# Patient Record
Sex: Female | Born: 1945 | Race: Black or African American | Hispanic: No | Smoking: Never smoker
Health system: Southern US, Community
[De-identification: ages and names within clinical notes are randomized; demographics above are authoritative.]

## PROBLEM LIST (undated history)

## (undated) DIAGNOSIS — E7800 Pure hypercholesterolemia, unspecified: Secondary | ICD-10-CM

## (undated) DIAGNOSIS — R42 Dizziness and giddiness: Secondary | ICD-10-CM

## (undated) DIAGNOSIS — E559 Vitamin D deficiency, unspecified: Secondary | ICD-10-CM

## (undated) DIAGNOSIS — I10 Essential (primary) hypertension: Secondary | ICD-10-CM

## (undated) HISTORY — PX: ANKLE SURGERY: SHX546

---

## 1997-11-08 ENCOUNTER — Encounter: Admission: RE | Admit: 1997-11-08 | Discharge: 1997-11-08 | Payer: Self-pay | Admitting: Hematology and Oncology

## 1998-03-24 ENCOUNTER — Emergency Department (HOSPITAL_COMMUNITY): Admission: EM | Admit: 1998-03-24 | Discharge: 1998-03-24 | Payer: Self-pay | Admitting: Family Medicine

## 1998-03-30 ENCOUNTER — Encounter: Admission: RE | Admit: 1998-03-30 | Discharge: 1998-03-30 | Payer: Self-pay | Admitting: Internal Medicine

## 1998-04-24 ENCOUNTER — Encounter: Admission: RE | Admit: 1998-04-24 | Discharge: 1998-04-24 | Payer: Self-pay | Admitting: Internal Medicine

## 1998-06-13 ENCOUNTER — Encounter: Admission: RE | Admit: 1998-06-13 | Discharge: 1998-06-13 | Payer: Self-pay | Admitting: Internal Medicine

## 1998-06-20 ENCOUNTER — Inpatient Hospital Stay (HOSPITAL_COMMUNITY): Admission: EM | Admit: 1998-06-20 | Discharge: 1998-06-22 | Payer: Self-pay | Admitting: Emergency Medicine

## 1998-06-22 ENCOUNTER — Encounter: Payer: Self-pay | Admitting: Internal Medicine

## 1998-07-05 ENCOUNTER — Encounter: Admission: RE | Admit: 1998-07-05 | Discharge: 1998-07-05 | Payer: Self-pay | Admitting: Internal Medicine

## 1998-07-05 ENCOUNTER — Other Ambulatory Visit: Admission: RE | Admit: 1998-07-05 | Discharge: 1998-07-05 | Payer: Self-pay | Admitting: *Deleted

## 1998-10-16 ENCOUNTER — Encounter: Admission: RE | Admit: 1998-10-16 | Discharge: 1998-10-16 | Payer: Self-pay | Admitting: Internal Medicine

## 1998-12-12 ENCOUNTER — Encounter: Admission: RE | Admit: 1998-12-12 | Discharge: 1998-12-12 | Payer: Self-pay | Admitting: Internal Medicine

## 1999-02-18 ENCOUNTER — Encounter: Admission: RE | Admit: 1999-02-18 | Discharge: 1999-02-18 | Payer: Self-pay | Admitting: Hematology and Oncology

## 1999-05-07 ENCOUNTER — Other Ambulatory Visit: Admission: RE | Admit: 1999-05-07 | Discharge: 1999-05-07 | Payer: Self-pay | Admitting: Obstetrics

## 1999-05-07 ENCOUNTER — Encounter: Admission: RE | Admit: 1999-05-07 | Discharge: 1999-05-07 | Payer: Self-pay | Admitting: Obstetrics & Gynecology

## 2002-03-06 ENCOUNTER — Emergency Department (HOSPITAL_COMMUNITY): Admission: EM | Admit: 2002-03-06 | Discharge: 2002-03-06 | Payer: Self-pay

## 2009-12-04 ENCOUNTER — Emergency Department (HOSPITAL_COMMUNITY): Admission: EM | Admit: 2009-12-04 | Discharge: 2009-12-04 | Payer: Self-pay | Admitting: Family Medicine

## 2010-02-20 ENCOUNTER — Emergency Department (HOSPITAL_COMMUNITY): Admission: EM | Admit: 2010-02-20 | Discharge: 2010-02-20 | Payer: Self-pay | Admitting: Family Medicine

## 2011-03-13 ENCOUNTER — Inpatient Hospital Stay (INDEPENDENT_AMBULATORY_CARE_PROVIDER_SITE_OTHER)
Admission: RE | Admit: 2011-03-13 | Discharge: 2011-03-13 | Disposition: A | Discharge status: Home / Self Care | Payer: PRIVATE HEALTH INSURANCE | Source: Ambulatory Visit | Attending: Family Medicine | Admitting: Family Medicine

## 2011-03-13 DIAGNOSIS — T148 Other injury of unspecified body region: Secondary | ICD-10-CM

## 2012-01-09 ENCOUNTER — Emergency Department (HOSPITAL_COMMUNITY)
Admission: EM | Admit: 2012-01-09 | Discharge: 2012-01-10 | Disposition: A | Discharge status: Home / Self Care | Payer: Medicaid - Out of State | Attending: Emergency Medicine | Admitting: Emergency Medicine

## 2012-01-09 ENCOUNTER — Encounter (HOSPITAL_COMMUNITY): Payer: Self-pay | Admitting: *Deleted

## 2012-01-09 DIAGNOSIS — R42 Dizziness and giddiness: Secondary | ICD-10-CM | POA: Insufficient documentation

## 2012-01-09 DIAGNOSIS — Z79899 Other long term (current) drug therapy: Secondary | ICD-10-CM | POA: Insufficient documentation

## 2012-01-09 DIAGNOSIS — I10 Essential (primary) hypertension: Secondary | ICD-10-CM | POA: Insufficient documentation

## 2012-01-09 HISTORY — DX: Essential (primary) hypertension: I10

## 2012-01-09 LAB — URINALYSIS, ROUTINE W REFLEX MICROSCOPIC
Glucose, UA: NEGATIVE mg/dL
Hgb urine dipstick: NEGATIVE
Ketones, ur: NEGATIVE mg/dL
Protein, ur: NEGATIVE mg/dL
pH: 6 (ref 5.0–8.0)

## 2012-01-09 LAB — CBC
HCT: 39.3 % (ref 36.0–46.0)
Hemoglobin: 13.5 g/dL (ref 12.0–15.0)
MCV: 87.7 fL (ref 78.0–100.0)
RDW: 12.7 % (ref 11.5–15.5)
WBC: 7.2 10*3/uL (ref 4.0–10.5)

## 2012-01-09 MED ORDER — MECLIZINE HCL 25 MG PO TABS
25.0000 mg | ORAL_TABLET | Freq: Once | ORAL | Status: AC
  Administered 2012-01-09: 25 mg via ORAL
  Filled 2012-01-09: qty 1

## 2012-01-09 MED ORDER — SODIUM CHLORIDE 0.9 % IV SOLN
INTRAVENOUS | Status: DC
  Administered 2012-01-09: via INTRAVENOUS

## 2012-01-09 MED ORDER — LORAZEPAM 1 MG PO TABS
1.0000 mg | ORAL_TABLET | Freq: Once | ORAL | Status: AC
  Administered 2012-01-09: 1 mg via ORAL
  Filled 2012-01-09: qty 1

## 2012-01-09 NOTE — ED Notes (Addendum)
Pt c/o dizziness x3 days. AAOx4. Pt reports having hx of vertigo, but has not taken medications for since 7/19 because she was feeling better. She took Allegra on 8/28 at 1930. Pt has been ambulatory without assistance in ED. Pt currently standing in hallway talking on phone.

## 2012-01-09 NOTE — ED Notes (Signed)
Pt c/o dizziness x 3 days; history of vertigo; mild headache and left shoulder pain; concerned about cholesterol due to zetia being stopped four months ago; denies chest pain

## 2012-01-09 NOTE — ED Notes (Signed)
Pt c/o lightheadedness x 3 days w/slight nausea.  Denies any other sx's.

## 2012-01-10 LAB — COMPREHENSIVE METABOLIC PANEL
Albumin: 4.6 g/dL (ref 3.5–5.2)
Alkaline Phosphatase: 77 U/L (ref 39–117)
BUN: 11 mg/dL (ref 6–23)
CO2: 28 mEq/L (ref 19–32)
Chloride: 101 mEq/L (ref 96–112)
Creatinine, Ser: 0.86 mg/dL (ref 0.50–1.10)
GFR calc Af Amer: 81 mL/min — ABNORMAL LOW (ref >90)
GFR calc non Af Amer: 70 mL/min — ABNORMAL LOW (ref >90)
Glucose, Bld: 102 mg/dL — ABNORMAL HIGH (ref 70–99)
Potassium: 4 mEq/L (ref 3.5–5.1)
Total Bilirubin: 0.8 mg/dL (ref 0.3–1.2)

## 2012-01-10 MED ORDER — LORAZEPAM 1 MG PO TABS: 1.0000 mg | ORAL_TABLET | Freq: Three times a day (TID) | ORAL | Status: AC | PRN

## 2012-01-10 MED ORDER — MECLIZINE HCL 25 MG PO TABS: 25.0000 mg | ORAL_TABLET | Freq: Four times a day (QID) | ORAL | Status: AC

## 2012-01-10 NOTE — ED Notes (Signed)
Pt ambulated without difficulty.  Pt c/o feeling less dizzy

## 2012-01-10 NOTE — ED Notes (Signed)
Pt able to ambulate 50 feet.

## 2012-01-10 NOTE — ED Provider Notes (Addendum)
History     CSN: [id number]  Arrival date & time 01/09/12  1945   First MD Initiated Contact with Patient 01/10/12 [phone]      Chief Complaint  Patient presents with  . Dizziness    (Consider location/radiation/quality/duration/timing/severity/associated sxs/prior treatment) HPI Hx per PT. Has long standing h/o vertigo followed by PCP in [place].  HAs had extensive work ups and CT and MRI.  She is here visiting and having her typical vertigo symptoms, dizzy associated with position. nausea no vomiting. No HA. No weakness/ numbness, no new symptoms. Mod in severity. No trouble with speech or gait. Past Medical History  Diagnosis Date  . Hypertension     History reviewed. No pertinent past surgical history.  No family history on file.  History  Substance Use Topics  . Smoking status: Never Smoker   . Smokeless tobacco: Not on file  . Alcohol Use:     OB History    Grav Para Term Preterm Abortions TAB SAB Ect Mult Living                  Review of Systems  Constitutional: Negative for fever and chills.  HENT: Negative for neck pain and neck stiffness.   Eyes: Negative for pain.  Respiratory: Negative for shortness of breath.   Cardiovascular: Negative for chest pain.  Gastrointestinal: Negative for abdominal pain.  Genitourinary: Negative for dysuria.  Musculoskeletal: Negative for back pain.  Skin: Negative for rash.  Neurological: Positive for dizziness. Negative for headaches.  All other systems reviewed and are negative.    Allergies  Codeine; Vytorin; and Lipitor  Home Medications   Current Outpatient Rx  Name Route Sig Dispense Refill  . AMLODIPINE BESYLATE 5 MG PO TABS Oral Take 5 mg by mouth daily.    Marland Kitchen MECLIZINE HCL 25 MG PO TABS Oral Take 12.5 mg by mouth 3 (three) times daily as needed. Dizziness    . SIMVASTATIN 40 MG PO TABS Oral Take 40 mg by mouth every evening.    Marland Kitchen LORAZEPAM 1 MG PO TABS Oral Take 1 tablet (1 mg total) by mouth 3 (three) times  daily as needed. 12 tablet 0  . MECLIZINE HCL 25 MG PO TABS Oral Take 1 tablet (25 mg total) by mouth 4 (four) times daily. 28 tablet 0    BP 143/87  Pulse 78  Temp 98.2 F (36.8 C) (Oral)  Resp 20  Wt 171 lb (77.565 kg)  SpO2 100%  Physical Exam  Constitutional: She is oriented to person, place, and time. She appears well-developed and well-nourished.  HENT:  Head: Normocephalic and atraumatic.  Eyes: Conjunctivae and EOM are normal. Pupils are equal, round, and reactive to light.  Neck: Full passive range of motion without pain. Neck supple. No thyromegaly present.       No meningismus  Cardiovascular: Normal rate, regular rhythm, S1 normal, S2 normal and intact distal pulses.   Pulmonary/Chest: Effort normal and breath sounds normal.  Abdominal: Soft. Bowel sounds are normal. There is no tenderness. There is no CVA tenderness.  Musculoskeletal: Normal range of motion.  Neurological: She is alert and oriented to person, place, and time. She has normal strength and normal reflexes. She displays normal reflexes. No cranial nerve deficit or sensory deficit. She exhibits normal muscle tone. She displays a negative Romberg sign. Coordination normal. GCS eye subscore is 4. GCS verbal subscore is 5. GCS motor subscore is 6.       Normal Gait  Skin:  Skin is warm and dry. No rash noted. No cyanosis. Nails show no clubbing.  Psychiatric: She has a normal mood and affect. Her speech is normal and behavior is normal.    ED Course  Procedures (including critical care time)  Results for orders placed during the hospital encounter of 01/09/12  URINALYSIS, ROUTINE W REFLEX MICROSCOPIC      Component Value Range   Color, Urine YELLOW  YELLOW   APPearance CLEAR  CLEAR   Specific Gravity, Urine 1.012  1.005 - 1.030   pH 6.0  5.0 - 8.0   Glucose, UA NEGATIVE  NEGATIVE mg/dL   Hgb urine dipstick NEGATIVE  NEGATIVE   Bilirubin Urine NEGATIVE  NEGATIVE   Ketones, ur NEGATIVE  NEGATIVE mg/dL    Protein, ur NEGATIVE  NEGATIVE mg/dL   Urobilinogen, UA 0.2  0.0 - 1.0 mg/dL   Nitrite NEGATIVE  NEGATIVE   Leukocytes, UA NEGATIVE  NEGATIVE  CBC      Component Value Range   WBC 7.2  4.0 - 10.5 K/uL   RBC 4.48  3.87 - 5.11 MIL/uL   Hemoglobin 13.5  12.0 - 15.0 g/dL   HCT 16.1  09.6 - 04.5 %   MCV 87.7  78.0 - 100.0 fL   MCH 30.1  26.0 - 34.0 pg   MCHC 34.4  30.0 - 36.0 g/dL   RDW 40.9  81.1 - 91.4 %   Platelets 297  150 - 400 K/uL  COMPREHENSIVE METABOLIC PANEL      Component Value Range   Sodium 141  135 - 145 mEq/L   Potassium 4.0  3.5 - 5.1 mEq/L   Chloride 101  96 - 112 mEq/L   CO2 28  19 - 32 mEq/L   Glucose, Bld 102 (*) 70 - 99 mg/dL   BUN 11  6 - 23 mg/dL   Creatinine, Ser 7.82  0.50 - 1.10 mg/dL   Calcium 95.6  8.4 - 21.3 mg/dL   Total Protein 8.5 (*) 6.0 - 8.3 g/dL   Albumin 4.6  3.5 - 5.2 g/dL   AST 25  0 - 37 U/L   ALT 30  0 - 35 U/L   Alkaline Phosphatase 77  39 - 117 U/L   Total Bilirubin 0.8  0.3 - 1.2 mg/dL   GFR calc non Af Amer 70 (*) >90 mL/min   GFR calc Af Amer 81 (*) >90 mL/min   IVFs. Ativan and antivert provided   1. Vertigo     4:58 AM ambulates NAD, vertigo much improved requesting to be discharged home.   No indication for emergent imaging or admit at this time. Reliable historian agrees to strict return precautions.   MDM  VS and nursing notes reviewed. Improved with IVfs and medications. Labs reviewed as above - WNL.         Sunnie Nielsen, MD 01/10/12 0459    Date: 02/20/2012  Rate: 78  Rhythm: NSR  QRS Axis: nl  Intervals: nl  ST/T Wave abnormalities: NST  Conduction Disutrbances: none  Narrative Interpretation:   Old EKG Reviewed: none    Sunnie Nielsen, MD 02/20/12 1112

## 2012-06-17 DIAGNOSIS — Z6829 Body mass index (BMI) 29.0-29.9, adult: Secondary | ICD-10-CM | POA: Diagnosis not present

## 2012-06-17 DIAGNOSIS — K759 Inflammatory liver disease, unspecified: Secondary | ICD-10-CM | POA: Diagnosis not present

## 2012-06-22 DIAGNOSIS — B1910 Unspecified viral hepatitis B without hepatic coma: Secondary | ICD-10-CM | POA: Diagnosis not present

## 2012-06-22 DIAGNOSIS — K759 Inflammatory liver disease, unspecified: Secondary | ICD-10-CM | POA: Diagnosis not present

## 2012-06-22 DIAGNOSIS — Z09 Encounter for follow-up examination after completed treatment for conditions other than malignant neoplasm: Secondary | ICD-10-CM | POA: Diagnosis not present

## 2012-06-22 DIAGNOSIS — Z6829 Body mass index (BMI) 29.0-29.9, adult: Secondary | ICD-10-CM | POA: Diagnosis not present

## 2012-10-22 DIAGNOSIS — R21 Rash and other nonspecific skin eruption: Secondary | ICD-10-CM | POA: Diagnosis not present

## 2012-10-23 DIAGNOSIS — I10 Essential (primary) hypertension: Secondary | ICD-10-CM | POA: Diagnosis not present

## 2012-10-23 DIAGNOSIS — E7800 Pure hypercholesterolemia, unspecified: Secondary | ICD-10-CM | POA: Diagnosis not present

## 2012-10-23 DIAGNOSIS — Z760 Encounter for issue of repeat prescription: Secondary | ICD-10-CM | POA: Diagnosis not present

## 2012-11-19 DIAGNOSIS — I10 Essential (primary) hypertension: Secondary | ICD-10-CM | POA: Diagnosis not present

## 2012-11-19 DIAGNOSIS — E785 Hyperlipidemia, unspecified: Secondary | ICD-10-CM | POA: Diagnosis not present

## 2012-12-02 DIAGNOSIS — H4010X0 Unspecified open-angle glaucoma, stage unspecified: Secondary | ICD-10-CM | POA: Diagnosis not present

## 2012-12-02 DIAGNOSIS — Z1231 Encounter for screening mammogram for malignant neoplasm of breast: Secondary | ICD-10-CM | POA: Diagnosis not present

## 2012-12-02 DIAGNOSIS — N63 Unspecified lump in unspecified breast: Secondary | ICD-10-CM | POA: Diagnosis not present

## 2012-12-03 DIAGNOSIS — E785 Hyperlipidemia, unspecified: Secondary | ICD-10-CM | POA: Diagnosis not present

## 2012-12-03 DIAGNOSIS — I10 Essential (primary) hypertension: Secondary | ICD-10-CM | POA: Diagnosis not present

## 2013-03-22 ENCOUNTER — Encounter (HOSPITAL_COMMUNITY): Payer: Self-pay | Admitting: Emergency Medicine

## 2013-03-22 ENCOUNTER — Emergency Department (HOSPITAL_COMMUNITY)
Admission: EM | Admit: 2013-03-22 | Discharge: 2013-03-22 | Disposition: A | Discharge status: Home / Self Care | Payer: Medicare Other | Attending: Emergency Medicine | Admitting: Emergency Medicine

## 2013-03-22 DIAGNOSIS — I10 Essential (primary) hypertension: Secondary | ICD-10-CM | POA: Insufficient documentation

## 2013-03-22 DIAGNOSIS — Y929 Unspecified place or not applicable: Secondary | ICD-10-CM | POA: Insufficient documentation

## 2013-03-22 DIAGNOSIS — F411 Generalized anxiety disorder: Secondary | ICD-10-CM | POA: Diagnosis not present

## 2013-03-22 DIAGNOSIS — IMO0002 Reserved for concepts with insufficient information to code with codable children: Secondary | ICD-10-CM | POA: Diagnosis not present

## 2013-03-22 DIAGNOSIS — S90569A Insect bite (nonvenomous), unspecified ankle, initial encounter: Secondary | ICD-10-CM | POA: Insufficient documentation

## 2013-03-22 DIAGNOSIS — Z79899 Other long term (current) drug therapy: Secondary | ICD-10-CM | POA: Diagnosis not present

## 2013-03-22 DIAGNOSIS — Y939 Activity, unspecified: Secondary | ICD-10-CM | POA: Insufficient documentation

## 2013-03-22 DIAGNOSIS — R21 Rash and other nonspecific skin eruption: Secondary | ICD-10-CM | POA: Diagnosis not present

## 2013-03-22 DIAGNOSIS — W57XXXA Bitten or stung by nonvenomous insect and other nonvenomous arthropods, initial encounter: Secondary | ICD-10-CM | POA: Diagnosis not present

## 2013-03-22 MED ORDER — HYDROCORTISONE 2.5 % EX LOTN: TOPICAL_LOTION | Freq: Two times a day (BID) | CUTANEOUS | Status: DC

## 2013-03-22 NOTE — ED Notes (Addendum)
Patient reports that she had an anxiety attack yesterday and EMS was called. Patient did not transport to the ED. Patient states her BP was elevated yesterday and states she wanted to get her BP checked today. Patient c/o flea bites x 3 weeks.

## 2013-03-22 NOTE — ED Provider Notes (Signed)
CSN: [id number]     Arrival date & time 03/22/13  1250 History  This chart was scribed for non-physician practitioner working with Dagmar Hait, MD by Ashley Jacobs, ED scribe. This patient was seen in room WTR7/WTR7 and the patient's care was started at 2:42 PM.   First MD Initiated Contact with Patient 03/22/13 1408     Chief Complaint  Patient presents with  . Hypertension  . insect bites    (Consider location/radiation/quality/duration/timing/severity/associated sxs/prior Treatment) The history is provided by the patient and medical records. No language interpreter was used.   HPI Comments: Susan Berg is a 67 y.o. female who presents to the Emergency Department complaining of HTN ( she has a medical hx of HTN) and insect bites. Her BP upon arrival is 123/77 mmHg. Pt reports her BP was elevated yesterday after experiencing an anxiety attack. She reports that she called the EMS but she was not transported to the ED. Today is feels as though she is experiencing elevated BP and wants to have her BP checked. Pt takes Norvasc daily for her HTN.  She has not missed any doses. She denies headache, SOB, chest pain, vision changes, or any neurological deficits.   She also complains of flea bites that presented three weeks ago to her lower legs.  She reports that her friends dog had fleas.  She has not applied any treatment to the area.  Pt does not smoke, use drugs or alcohol. She denies SI and HI.   Her PCP is in [place], [place]. She is currently visiting from out of town. Past Medical History  Diagnosis Date  . Hypertension    History reviewed. No pertinent past surgical history. No family history on file. History  Substance Use Topics  . Smoking status: Never Smoker   . Smokeless tobacco: Not on file  . Alcohol Use:    OB History   Grav Para Term Preterm Abortions TAB SAB Ect Mult Living                 Review of Systems  Skin: Positive for wound (flea bites).   Psychiatric/Behavioral: The patient is nervous/anxious.   All other systems reviewed and are negative.    Allergies  Codeine; Vytorin; and Lipitor  Home Medications   Current Outpatient Rx  Name  Route  Sig  Dispense  Refill  . amLODipine (NORVASC) 5 MG tablet   Oral   Take 5 mg by mouth daily.         . simvastatin (ZOCOR) 40 MG tablet   Oral   Take 40 mg by mouth every evening.          BP 123/77  Pulse 82  Temp(Src) 98.2 F (36.8 C) (Oral)  Resp 18  Ht 5\' 5"  (1.651 m)  Wt 170 lb (77.111 kg)  BMI 28.29 kg/m2  SpO2 100% Physical Exam  Nursing note and vitals reviewed. Constitutional: She is oriented to person, place, and time. She appears well-developed and well-nourished. No distress.  HENT:  Head: Normocephalic.  Right Ear: External ear normal.  Left Ear: External ear normal.  Eyes: Conjunctivae and EOM are normal. Pupils are equal, round, and reactive to light.  Neck: Normal range of motion. Neck supple.  Cardiovascular: Normal rate, regular rhythm and normal heart sounds.   Pulmonary/Chest: Effort normal and breath sounds normal. No respiratory distress. She has no rales.  Abdominal: Bowel sounds are normal.  Musculoskeletal: Normal range of motion.  Neurological: She is  alert and oriented to person, place, and time. No cranial nerve deficit. Gait normal.  Skin: Skin is warm and dry. Rash noted. There is erythema.  Erythematous papular rash on bilateral feet and lower legs.  No drainage.  Psychiatric: She has a normal mood and affect. Her behavior is normal. She expresses no homicidal and no suicidal ideation. She expresses no suicidal plans and no homicidal plans.    ED Course  Procedures (including critical care time) DIAGNOSTIC STUDIES: Oxygen Saturation is 100% on room air, normal by my interpretation.    COORDINATION OF CARE: 2:45 PM Discussed course of care with pt which includes steroid cream. Pt understands and agrees.  Labs Review Labs  Reviewed - No data to display Imaging Review No results found.  EKG Interpretation   None       MDM  No diagnosis found. Patient presents today requesting to have her blood pressure checked.  She states that her blood pressure was elevated yesterday when she had a panic attack and was concerned that it may still be elevated.  Blood pressure 123/77 upon arrival in the ED.  Patient is currently not having any symptoms of elevated blood pressure.  Patient is also complaining of flea bites to the lower legs bilaterally.  Patient instructed to wash sheets, blankets, clothing in warm water.  Patient also told to try Hydrocortisone and Benadryl for itching.  Patient stable for discharge.  Return precautions given.    Santiago Glad, PA-C 03/23/13 1234

## 2013-03-23 NOTE — ED Provider Notes (Signed)
Medical screening examination/treatment/procedure(s) were performed by non-physician practitioner and as supervising physician I was immediately available for consultation/collaboration.  EKG Interpretation   None         William Aracelie Addis, MD 03/23/13 1506 

## 2013-07-01 DIAGNOSIS — I10 Essential (primary) hypertension: Secondary | ICD-10-CM | POA: Diagnosis not present

## 2013-07-01 DIAGNOSIS — E785 Hyperlipidemia, unspecified: Secondary | ICD-10-CM | POA: Diagnosis not present

## 2013-08-05 DIAGNOSIS — E785 Hyperlipidemia, unspecified: Secondary | ICD-10-CM | POA: Diagnosis not present

## 2013-08-05 DIAGNOSIS — I10 Essential (primary) hypertension: Secondary | ICD-10-CM | POA: Diagnosis not present

## 2013-08-08 DIAGNOSIS — L501 Idiopathic urticaria: Secondary | ICD-10-CM | POA: Diagnosis not present

## 2013-08-08 DIAGNOSIS — H01139 Eczematous dermatitis of unspecified eye, unspecified eyelid: Secondary | ICD-10-CM | POA: Diagnosis not present

## 2013-10-12 DIAGNOSIS — H40009 Preglaucoma, unspecified, unspecified eye: Secondary | ICD-10-CM | POA: Diagnosis not present

## 2013-10-12 DIAGNOSIS — I10 Essential (primary) hypertension: Secondary | ICD-10-CM | POA: Diagnosis not present

## 2013-10-12 DIAGNOSIS — E785 Hyperlipidemia, unspecified: Secondary | ICD-10-CM | POA: Diagnosis not present

## 2013-10-28 ENCOUNTER — Encounter (HOSPITAL_COMMUNITY): Payer: Self-pay | Admitting: Emergency Medicine

## 2013-10-28 ENCOUNTER — Emergency Department (HOSPITAL_COMMUNITY)
Admission: EM | Admit: 2013-10-28 | Discharge: 2013-10-28 | Disposition: A | Discharge status: Home / Self Care | Payer: Medicare Other | Attending: Emergency Medicine | Admitting: Emergency Medicine

## 2013-10-28 DIAGNOSIS — Z79899 Other long term (current) drug therapy: Secondary | ICD-10-CM | POA: Diagnosis not present

## 2013-10-28 DIAGNOSIS — R42 Dizziness and giddiness: Secondary | ICD-10-CM | POA: Diagnosis not present

## 2013-10-28 DIAGNOSIS — IMO0002 Reserved for concepts with insufficient information to code with codable children: Secondary | ICD-10-CM | POA: Diagnosis not present

## 2013-10-28 DIAGNOSIS — H9319 Tinnitus, unspecified ear: Secondary | ICD-10-CM | POA: Insufficient documentation

## 2013-10-28 DIAGNOSIS — R51 Headache: Secondary | ICD-10-CM | POA: Diagnosis not present

## 2013-10-28 DIAGNOSIS — I10 Essential (primary) hypertension: Secondary | ICD-10-CM | POA: Insufficient documentation

## 2013-10-28 MED ORDER — MECLIZINE HCL 25 MG PO TABS: 25.0000 mg | ORAL_TABLET | Freq: Two times a day (BID) | ORAL | Status: DC | PRN

## 2013-10-28 MED ORDER — DIAZEPAM 5 MG PO TABS: 5.0000 mg | ORAL_TABLET | Freq: Three times a day (TID) | ORAL | Status: DC | PRN

## 2013-10-28 MED ORDER — SODIUM CHLORIDE 0.9 % IV BOLUS (SEPSIS)
1000.0000 mL | Freq: Once | INTRAVENOUS | Status: AC
  Administered 2013-10-28: 1000 mL via INTRAVENOUS

## 2013-10-28 MED ORDER — PREDNISONE 20 MG PO TABS
60.0000 mg | ORAL_TABLET | Freq: Once | ORAL | Status: AC
  Administered 2013-10-28: 60 mg via ORAL
  Filled 2013-10-28: qty 3

## 2013-10-28 MED ORDER — DIAZEPAM 5 MG PO TABS
5.0000 mg | ORAL_TABLET | Freq: Once | ORAL | Status: AC
  Administered 2013-10-28: 5 mg via ORAL
  Filled 2013-10-28: qty 1

## 2013-10-28 MED ORDER — MECLIZINE HCL 25 MG PO TABS
25.0000 mg | ORAL_TABLET | Freq: Once | ORAL | Status: AC
  Administered 2013-10-28: 25 mg via ORAL
  Filled 2013-10-28: qty 1

## 2013-10-28 MED ORDER — METHYLPREDNISOLONE (PAK) 4 MG PO TABS: ORAL_TABLET | ORAL | Status: DC

## 2013-10-28 MED ORDER — ONDANSETRON HCL 4 MG/2ML IJ SOLN
4.0000 mg | Freq: Once | INTRAMUSCULAR | Status: AC
  Administered 2013-10-28: 4 mg via INTRAVENOUS
  Filled 2013-10-28: qty 2

## 2013-10-28 NOTE — Discharge Instructions (Signed)

## 2013-10-28 NOTE — ED Notes (Signed)
Pt c/o dizziness x 3 days, +nausea and L ear pain. Pt states she has taken Meclizine with no relief.

## 2013-10-28 NOTE — Progress Notes (Signed)
  CARE MANAGEMENT ED NOTE 10/28/2013  Patient:  Susan Berg, Susan Berg   Account Number:  [id number]  Date Initiated:  10/28/2013  Documentation initiated by:  Livia Snellen  Subjective/Objective Assessment:   Patient presents to ED with dizziness, nausea and left ear pain     Subjective/Objective Assessment Detail:     Action/Plan:   Action/Plan Detail:   Anticipated DC Date:       Status Recommendation to Physician:   Result of Recommendation:    Other ED [place]  Other  PCP issues    Choice offered to / List presented to:            Status of service:  Completed, signed off  ED Comments:   ED Comments Detail:  EDCM spoke to patient at bedside.  Patient's pcp is in [place], patient is visiting.

## 2013-10-28 NOTE — ED Provider Notes (Signed)
CSN: [id number]     Arrival date & time 10/28/13  2032 History   First MD Initiated Contact with Patient 10/28/13 2047     Chief Complaint  Patient presents with  . Dizziness     (Consider location/radiation/quality/duration/timing/severity/associated sxs/prior Treatment) Patient is a 68 y.o. female presenting with dizziness. The history is provided by the patient.  Dizziness Quality:  Room spinning Severity:  Moderate Onset quality:  Gradual Duration:  3 days Timing:  Constant Progression:  Unchanged Chronicity:  Recurrent Context: eye movement and head movement   Context: not when bending over, not with loss of consciousness, not when standing up and not when urinating   Relieved by:  Nothing Worsened by:  Nothing tried Ineffective treatments: meclizine. Associated symptoms: headaches and tinnitus (occasional, with some L otalgia)   Associated symptoms: no chest pain, no diarrhea, no shortness of breath and no vomiting     Past Medical History  Diagnosis Date  . Hypertension    History reviewed. No pertinent past surgical history. No family history on file. History  Substance Use Topics  . Smoking status: Never Smoker   . Smokeless tobacco: Not on file  . Alcohol Use: No   OB History   Grav Para Term Preterm Abortions TAB SAB Ect Mult Living                 Review of Systems  Constitutional: Negative for fever.  HENT: Positive for tinnitus (occasional, with some L otalgia).   Respiratory: Negative for cough and shortness of breath.   Cardiovascular: Negative for chest pain.  Gastrointestinal: Negative for vomiting and diarrhea.  Neurological: Positive for dizziness and headaches.  All other systems reviewed and are negative.     Allergies  Codeine; Vytorin; and Lipitor  Home Medications   Prior to Admission medications   Medication Sig Start Date End Date Taking? Authorizing Provider  amLODipine (NORVASC) 5 MG tablet Take 5 mg by mouth daily.     Historical Provider, MD  hydrocortisone 2.5 % lotion Apply topically 2 (two) times daily. 03/22/13   Heather Laisure, PA-C  hydrocortisone 2.5 % lotion Apply topically 2 (two) times daily. 03/22/13   Heather Laisure, PA-C  simvastatin (ZOCOR) 40 MG tablet Take 40 mg by mouth every evening.    Historical Provider, MD   BP 123/72  Pulse 80  Temp(Src) 98.1 F (36.7 C) (Oral)  Resp 16  Ht 5\' 5"  (1.651 m)  Wt 166 lb (75.297 kg)  BMI 27.62 kg/m2  SpO2 100% Physical Exam  Nursing note and vitals reviewed. Constitutional: She is oriented to person, place, and time. She appears well-developed and well-nourished. No distress.  HENT:  Head: Normocephalic and atraumatic.  Right Ear: Tympanic membrane normal.  Left Ear: Tympanic membrane normal.  Mouth/Throat: Oropharynx is clear and moist.  Eyes: EOM are normal. Pupils are equal, round, and reactive to light.  Neck: Normal range of motion. Neck supple.  Cardiovascular: Normal rate and regular rhythm.  Exam reveals no friction rub.   No murmur heard. Pulmonary/Chest: Effort normal and breath sounds normal. No respiratory distress. She has no wheezes. She has no rales.  Abdominal: Soft. She exhibits no distension. There is no tenderness. There is no rebound.  Musculoskeletal: Normal range of motion. She exhibits no edema.  Neurological: She is alert and oriented to person, place, and time. No cranial nerve deficit. She exhibits normal muscle tone. Coordination normal.  Skin: No rash noted. She is not diaphoretic.    ED  Course  Procedures (including critical care time) Labs Review Labs Reviewed - No data to display  Imaging Review No results found.   EKG Interpretation None      MDM   Final diagnoses:  Vertigo  Dizziness    [name] with hx of vertigo presents with dizziness for 3 days. No relief with meclizine at home. She has nausea and cannot eat. No fevers. History of similar, received ativan with meclizine in the past with good  results. Hx of this with workup in the past. Here vitals stable. Doing EOM testing, gets dizzy with extremes of eye range of motion. No nystagmus. Normal coordination.  Exam consistent with vertigo - given valium, meclizine, steroids, fluids. After observation, much improved. Will give valium, medrol dose pak, meclizine. Instructed to f/u with PCP. With associated tinnitus, given steroids to cover for labyrinthitis.    Osvaldo Shipper, MD 10/28/13 [phone]

## 2013-10-28 NOTE — ED Notes (Signed)
EDP at bedside  

## 2014-02-08 ENCOUNTER — Emergency Department (HOSPITAL_COMMUNITY)
Admission: EM | Admit: 2014-02-08 | Discharge: 2014-02-08 | Disposition: A | Discharge status: Home / Self Care | Payer: Medicare Other | Attending: Emergency Medicine | Admitting: Emergency Medicine

## 2014-02-08 ENCOUNTER — Encounter (HOSPITAL_COMMUNITY): Payer: Self-pay | Admitting: Emergency Medicine

## 2014-02-08 DIAGNOSIS — R42 Dizziness and giddiness: Secondary | ICD-10-CM | POA: Diagnosis not present

## 2014-02-08 DIAGNOSIS — I10 Essential (primary) hypertension: Secondary | ICD-10-CM | POA: Insufficient documentation

## 2014-02-08 DIAGNOSIS — Z79899 Other long term (current) drug therapy: Secondary | ICD-10-CM | POA: Insufficient documentation

## 2014-02-08 HISTORY — DX: Dizziness and giddiness: R42

## 2014-02-08 LAB — URINALYSIS, ROUTINE W REFLEX MICROSCOPIC
Bilirubin Urine: NEGATIVE
GLUCOSE, UA: NEGATIVE mg/dL
HGB URINE DIPSTICK: NEGATIVE
Ketones, ur: NEGATIVE mg/dL
Leukocytes, UA: NEGATIVE
Nitrite: NEGATIVE
PH: 6 (ref 5.0–8.0)
Protein, ur: NEGATIVE mg/dL
SPECIFIC GRAVITY, URINE: 1.007 (ref 1.005–1.030)
Urobilinogen, UA: 0.2 mg/dL (ref 0.0–1.0)

## 2014-02-08 LAB — CBG MONITORING, ED: Glucose-Capillary: 82 mg/dL (ref 70–99)

## 2014-02-08 MED ORDER — DIAZEPAM 5 MG PO TABS: 2.5000 mg | ORAL_TABLET | Freq: Three times a day (TID) | ORAL | Status: DC | PRN

## 2014-02-08 MED ORDER — MECLIZINE HCL 50 MG PO TABS: 25.0000 mg | ORAL_TABLET | Freq: Three times a day (TID) | ORAL | Status: DC | PRN

## 2014-02-08 MED ORDER — MECLIZINE HCL 25 MG PO TABS: 25.0000 mg | ORAL_TABLET | Freq: Once | ORAL | Status: DC

## 2014-02-08 NOTE — ED Notes (Signed)
Pt states that she has been having dizziness x 5 days.  Pt w/ hx of vertigo and is prescribed meclizine.  Has had lots of stressors, lots of family deaths.  States "I thought my pH might be off so I got one of those repHresh kits (vaginal douche) and that helped".

## 2014-02-08 NOTE — ED Notes (Signed)
Patient with no complaints at this time. Respirations even and unlabored. Skin warm/dry. Discharge instructions reviewed with patient at this time. Patient given opportunity to voice concerns/ask questions. Patient discharged at this time and left Emergency Department with steady gait.   

## 2014-02-08 NOTE — ED Notes (Addendum)
Pt states she is currently feeling dizzy. Pt denies chest pain, SOB and numbness. Pt states she is currently taking her prescribed daily medications. Pt is calm at this time. Will continue to monitor. Awaiting orders from provider.

## 2014-02-08 NOTE — ED Provider Notes (Signed)
CSN: [id number]     Arrival date & time 02/08/14  6578 History   First MD Initiated Contact with Patient 02/08/14 1157     Chief Complaint  Patient presents with  . Dizziness     (Consider location/radiation/quality/duration/timing/severity/associated sxs/prior Treatment) HPI The patient reports that she's had dizziness as been coming and going for about 5 days, she reports it seemed worse over the past couple of days.  She reports it is made worse by any position changes and if she bends forward it makes things spin. She reports when she is up and walking she feels like she is moving to the side. Patient denies any focal weakness numbness or tingling. There has been no blurred vision double vision or loss of vision. She does have a history of vertigo. The patient has been trying meclizine which sometimes it seems to help but she reports mostly it makes her kind of drowsy. She has not had fever chills or general illness. Patient also notes that she thought her urine seemed like it might be "strong". However she denies pain burning or urgency.  Patient is recently under a fair amount of stress. Her brother recently died of cancer as well as her father.  Past Medical History  Diagnosis Date  . Hypertension   . Vertigo    No past surgical history on file. No family history on file. History  Substance Use Topics  . Smoking status: Never Smoker   . Smokeless tobacco: Not on file  . Alcohol Use: No   OB History   Grav Para Term Preterm Abortions TAB SAB Ect Mult Living                 Review of Systems  10 Systems reviewed and are negative for acute change except as noted in the HPI.   Allergies  Codeine; Vytorin; and Lipitor  Home Medications   Prior to Admission medications   Medication Sig Start Date End Date Taking? Authorizing Provider  amLODipine (NORVASC) 5 MG tablet Take 5 mg by mouth daily.   Yes Historical Provider, MD  diazepam (VALIUM) 5 MG tablet Take 5 mg by  mouth every 8 (eight) hours as needed for anxiety (dizziness).   Yes Historical Provider, MD  hydroxypropyl methylcellulose (ISOPTO TEARS) 2.5 % ophthalmic solution Place 2 drops into both eyes daily as needed for dry eyes (dry eyes).    Yes Historical Provider, MD  meclizine (ANTIVERT) 25 MG tablet Take 25 mg by mouth 2 (two) times daily as needed for dizziness (dizziness).   Yes Historical Provider, MD  simvastatin (ZOCOR) 40 MG tablet Take 40 mg by mouth every evening.   Yes Historical Provider, MD  diazepam (VALIUM) 5 MG tablet Take 0.5 tablets (2.5 mg total) by mouth every 8 (eight) hours as needed for anxiety (VERTIGO). 02/08/14   Charlesetta Shanks, MD   BP 125/85  Pulse 73  Temp(Src) 98 F (36.7 C) (Oral)  Resp 15  SpO2 99% Physical Exam  Constitutional: She is oriented to person, place, and time. She appears well-developed and well-nourished.  HENT:  Head: Normocephalic and atraumatic.  Eyes: EOM are normal. Pupils are equal, round, and reactive to light.  Neck: Neck supple.  Cardiovascular: Normal rate, regular rhythm, normal heart sounds and intact distal pulses.   Pulmonary/Chest: Effort normal and breath sounds normal.  Abdominal: Soft. Bowel sounds are normal. She exhibits no distension. There is no tenderness.  Musculoskeletal: Normal range of motion. She exhibits no edema.  Neurological:  She is alert and oriented to person, place, and time. She has normal strength. Coordination normal. GCS eye subscore is 4. GCS verbal subscore is 5. GCS motor subscore is 6.  Skin: Skin is warm, dry and intact.  Psychiatric: She has a normal mood and affect.    ED Course  Procedures (including critical care time) Labs Review Labs Reviewed  URINALYSIS, ROUTINE W REFLEX MICROSCOPIC - Abnormal; Notable for the following:    APPearance CLOUDY (*)    All other components within normal limits  CBG MONITORING, ED    Imaging Review No results found.   EKG Interpretation None      MDM    Final diagnoses:  Vertigo   At this point in time patient has well appearance and symptoms do seem most consistent with her chronic vertigo. Her physical examination is normal vital signs are normal. Urinalysis is negative for UTI. Patient is counseled to continue using the meclizine and some when necessary Valium if needed for symptom control. She is to followup with her family physician. Patient is advised she needs to return if she should have worsening or changing symptoms.    Charlesetta Shanks, MD 02/08/14 [phone]

## 2014-06-01 ENCOUNTER — Emergency Department (HOSPITAL_COMMUNITY)
Admission: EM | Admit: 2014-06-01 | Discharge: 2014-06-01 | Disposition: A | Discharge status: Home / Self Care | Payer: Medicare Other | Attending: Emergency Medicine | Admitting: Emergency Medicine

## 2014-06-01 ENCOUNTER — Encounter (HOSPITAL_COMMUNITY): Payer: Self-pay | Admitting: Emergency Medicine

## 2014-06-01 DIAGNOSIS — B9689 Other specified bacterial agents as the cause of diseases classified elsewhere: Secondary | ICD-10-CM | POA: Diagnosis not present

## 2014-06-01 DIAGNOSIS — Z79899 Other long term (current) drug therapy: Secondary | ICD-10-CM | POA: Insufficient documentation

## 2014-06-01 DIAGNOSIS — N390 Urinary tract infection, site not specified: Secondary | ICD-10-CM | POA: Diagnosis not present

## 2014-06-01 DIAGNOSIS — Z760 Encounter for issue of repeat prescription: Secondary | ICD-10-CM | POA: Diagnosis not present

## 2014-06-01 DIAGNOSIS — I10 Essential (primary) hypertension: Secondary | ICD-10-CM | POA: Diagnosis not present

## 2014-06-01 DIAGNOSIS — R300 Dysuria: Secondary | ICD-10-CM | POA: Diagnosis present

## 2014-06-01 LAB — URINALYSIS, ROUTINE W REFLEX MICROSCOPIC
BILIRUBIN URINE: NEGATIVE
Glucose, UA: NEGATIVE mg/dL
Ketones, ur: NEGATIVE mg/dL
Nitrite: NEGATIVE
PH: 5.5 (ref 5.0–8.0)
Protein, ur: NEGATIVE mg/dL
SPECIFIC GRAVITY, URINE: 1.006 (ref 1.005–1.030)
UROBILINOGEN UA: 0.2 mg/dL (ref 0.0–1.0)

## 2014-06-01 LAB — URINE MICROSCOPIC-ADD ON

## 2014-06-01 MED ORDER — AMLODIPINE BESYLATE 5 MG PO TABS: 10.0000 mg | ORAL_TABLET | Freq: Every day | ORAL | Status: DC

## 2014-06-01 MED ORDER — DIAZEPAM 5 MG PO TABS: 5.0000 mg | ORAL_TABLET | Freq: Three times a day (TID) | ORAL | Status: DC | PRN

## 2014-06-01 MED ORDER — CEPHALEXIN 500 MG PO CAPS
500.0000 mg | ORAL_CAPSULE | Freq: Once | ORAL | Status: AC
  Administered 2014-06-01: 500 mg via ORAL
  Filled 2014-06-01: qty 1

## 2014-06-01 MED ORDER — CEPHALEXIN 500 MG PO CAPS: 500.0000 mg | ORAL_CAPSULE | Freq: Four times a day (QID) | ORAL | Status: DC

## 2014-06-01 MED ORDER — SIMVASTATIN 40 MG PO TABS: 20.0000 mg | ORAL_TABLET | Freq: Every day | ORAL | Status: DC

## 2014-06-01 NOTE — ED Provider Notes (Signed)
CSN: [id number]     Arrival date & time 06/01/14  6629 History   First MD Initiated Contact with Patient 06/01/14 1016     Chief Complaint  Patient presents with  . Medication Refill  . Dysuria     (Consider location/radiation/quality/duration/timing/severity/associated sxs/prior Treatment) Patient is a 69 y.o. female presenting with dysuria. The history is provided by the patient (the pt comlains of dysuria and needs med fill for a couple weeks until she gets home).  Dysuria Pain quality:  Burning Pain severity:  Moderate Onset quality:  Sudden Timing:  Constant Progression:  Waxing and waning Chronicity:  New Relieved by:  Nothing Associated symptoms: no abdominal pain     Past Medical History  Diagnosis Date  . Hypertension   . Vertigo    History reviewed. No pertinent past surgical history. No family history on file. History  Substance Use Topics  . Smoking status: Never Smoker   . Smokeless tobacco: Not on file  . Alcohol Use: No   OB History    No data available     Review of Systems  Constitutional: Negative for appetite change and fatigue.  HENT: Negative for congestion, ear discharge and sinus pressure.   Eyes: Negative for discharge.  Respiratory: Negative for cough.   Cardiovascular: Negative for chest pain.  Gastrointestinal: Negative for abdominal pain and diarrhea.  Genitourinary: Positive for dysuria. Negative for frequency and hematuria.  Musculoskeletal: Negative for back pain.  Skin: Negative for rash.  Neurological: Negative for seizures and headaches.  Psychiatric/Behavioral: Negative for hallucinations.      Allergies  Codeine; Vytorin; and Lipitor  Home Medications   Prior to Admission medications   Medication Sig Start Date End Date Taking? Authorizing Provider  hydroxypropyl methylcellulose (ISOPTO TEARS) 2.5 % ophthalmic solution Place 2 drops into both eyes daily as needed for dry eyes (dry eyes).    Yes Historical Provider, MD   meclizine (ANTIVERT) 50 MG tablet Take 0.5 tablets (25 mg total) by mouth 3 (three) times daily as needed. 02/08/14  Yes Charlesetta Shanks, MD  Miscellaneous Vaginal Products ([place]) Place vaginally once.   Yes Historical Provider, MD  amLODipine (NORVASC) 5 MG tablet Take 2 tablets (10 mg total) by mouth daily. 06/01/14   Maudry Diego, MD  cephALEXin (KEFLEX) 500 MG capsule Take 1 capsule (500 mg total) by mouth 4 (four) times daily. 06/01/14   Maudry Diego, MD  diazepam (VALIUM) 5 MG tablet Take 1 tablet (5 mg total) by mouth every 8 (eight) hours as needed for anxiety (spasms). 06/01/14   Maudry Diego, MD  simvastatin (ZOCOR) 40 MG tablet Take 0.5 tablets (20 mg total) by mouth daily. 06/01/14   Maudry Diego, MD   BP 136/80 mmHg  Pulse 88  Temp(Src) 98 F (36.7 C) (Oral)  Resp 16  SpO2 94% Physical Exam  Constitutional: She is oriented to person, place, and time. She appears well-developed.  HENT:  Head: Normocephalic.  Eyes: Conjunctivae and EOM are normal. No scleral icterus.  Neck: Neck supple. No thyromegaly present.  Cardiovascular: Normal rate and regular rhythm.  Exam reveals no gallop and no friction rub.   No murmur heard. Pulmonary/Chest: No stridor. She has no wheezes. She has no rales. She exhibits no tenderness.  Abdominal: She exhibits no distension. There is no tenderness. There is no rebound.  Musculoskeletal: Normal range of motion. She exhibits no edema.  Lymphadenopathy:    She has no cervical adenopathy.  Neurological:  She is oriented to person, place, and time. She exhibits normal muscle tone. Coordination normal.  Skin: No rash noted. No erythema.  Psychiatric: She has a normal mood and affect. Her behavior is normal.    ED Course  Procedures (including critical care time) Labs Review Labs Reviewed  URINALYSIS, ROUTINE W REFLEX MICROSCOPIC - Abnormal; Notable for the following:    APPearance CLOUDY (*)    Hgb urine dipstick TRACE  (*)    Leukocytes, UA LARGE (*)    All other components within normal limits  URINE MICROSCOPIC-ADD ON - Abnormal; Notable for the following:    Squamous Epithelial / LPF FEW (*)    All other components within normal limits    Imaging Review No results found.   EKG Interpretation None      MDM   Final diagnoses:  UTI (lower urinary tract infection)    Uti,   tx keflex    Maudry Diego, MD 06/01/14 1217

## 2014-06-01 NOTE — ED Notes (Signed)
Initial Contact - pt A+Ox4, reports needs refills for norvasc and zocor and valium.  Pt also reports "my pH is off down there".  Pt denies pain, dysuria or other complaints.  Reports recently here from [place].  Skin PWD.  Speaking full/clear sentences, rr even/un-lab.  MAEI.  Ambulatory with steady gait.  NAD.

## 2014-06-01 NOTE — ED Notes (Signed)
Per pt, states she ran out of Norvasc and Zocor a few days ago-here from [place]

## 2014-06-01 NOTE — Discharge Instructions (Signed)
Follow up with your pcp.

## 2014-06-10 ENCOUNTER — Emergency Department (INDEPENDENT_AMBULATORY_CARE_PROVIDER_SITE_OTHER)
Admission: EM | Admit: 2014-06-10 | Discharge: 2014-06-10 | Disposition: A | Discharge status: Home / Self Care | Payer: Medicare Other | Source: Home / Self Care | Attending: Emergency Medicine | Admitting: Emergency Medicine

## 2014-06-10 ENCOUNTER — Other Ambulatory Visit (HOSPITAL_COMMUNITY)
Admission: RE | Admit: 2014-06-10 | Discharge: 2014-06-10 | Disposition: A | Discharge status: Home / Self Care | Payer: Medicare Other | Source: Ambulatory Visit | Attending: Emergency Medicine | Admitting: Emergency Medicine

## 2014-06-10 ENCOUNTER — Encounter (HOSPITAL_COMMUNITY): Payer: Self-pay | Admitting: Emergency Medicine

## 2014-06-10 DIAGNOSIS — N888 Other specified noninflammatory disorders of cervix uteri: Secondary | ICD-10-CM | POA: Diagnosis not present

## 2014-06-10 DIAGNOSIS — A499 Bacterial infection, unspecified: Secondary | ICD-10-CM

## 2014-06-10 DIAGNOSIS — N8111 Cystocele, midline: Secondary | ICD-10-CM | POA: Diagnosis not present

## 2014-06-10 DIAGNOSIS — Z113 Encounter for screening for infections with a predominantly sexual mode of transmission: Secondary | ICD-10-CM | POA: Diagnosis not present

## 2014-06-10 DIAGNOSIS — N952 Postmenopausal atrophic vaginitis: Secondary | ICD-10-CM

## 2014-06-10 DIAGNOSIS — N898 Other specified noninflammatory disorders of vagina: Secondary | ICD-10-CM | POA: Diagnosis not present

## 2014-06-10 DIAGNOSIS — N760 Acute vaginitis: Secondary | ICD-10-CM | POA: Diagnosis present

## 2014-06-10 DIAGNOSIS — B9689 Other specified bacterial agents as the cause of diseases classified elsewhere: Secondary | ICD-10-CM

## 2014-06-10 MED ORDER — METRONIDAZOLE 500 MG PO TABS: 500.0000 mg | ORAL_TABLET | Freq: Two times a day (BID) | ORAL | Status: DC

## 2014-06-10 MED ORDER — FLUCONAZOLE 150 MG PO TABS: ORAL_TABLET | ORAL | Status: DC

## 2014-06-10 NOTE — ED Notes (Signed)
Reports having vaginal irritation with discharge shortly after completing antibiotics for a bladder infection.  Denies any other symptoms.  No otc treatments tried.  Symptoms present x 1 wk.

## 2014-06-10 NOTE — Discharge Instructions (Signed)
Vaginitis Vaginitis is an inflammation of the vagina. It is most often caused by a change in the normal balance of the bacteria and yeast that live in the vagina. This change in balance causes an overgrowth of certain bacteria or yeast, which causes the inflammation. There are different types of vaginitis, but the most common types are:  Bacterial vaginosis.  Yeast infection (candidiasis).  Trichomoniasis vaginitis. This is a sexually transmitted infection (STI).  Viral vaginitis.  Atropic vaginitis.  Allergic vaginitis. CAUSES  The cause depends on the type of vaginitis. Vaginitis can be caused by:  Bacteria (bacterial vaginosis).  Yeast (yeast infection).  A parasite (trichomoniasis vaginitis)  A virus (viral vaginitis).  Low hormone levels (atrophic vaginitis). Low hormone levels can occur during pregnancy, breastfeeding, or after menopause.  Irritants, such as bubble baths, scented tampons, and feminine sprays (allergic vaginitis). Other factors can change the normal balance of the yeast and bacteria that live in the vagina. These include:  Antibiotic medicines.  Poor hygiene.  Diaphragms, vaginal sponges, spermicides, birth control pills, and intrauterine devices (IUD).  Sexual intercourse.  Infection.  Uncontrolled diabetes.  A weakened immune system. SYMPTOMS  Symptoms can vary depending on the cause of the vaginitis. Common symptoms include:  Abnormal vaginal discharge.  The discharge is white, gray, or yellow with bacterial vaginosis.  The discharge is thick, white, and cheesy with a yeast infection.  The discharge is frothy and yellow or greenish with trichomoniasis.  A bad vaginal odor.  The odor is fishy with bacterial vaginosis.  Vaginal itching, pain, or swelling.  Painful intercourse.  Pain or burning when urinating. Sometimes, there are no symptoms. TREATMENT  Treatment will vary depending on the type of infection.   Bacterial  vaginosis and trichomoniasis are often treated with antibiotic creams or pills.  Yeast infections are often treated with antifungal medicines, such as vaginal creams or suppositories.  Viral vaginitis has no cure, but symptoms can be treated with medicines that relieve discomfort. Your sexual partner should be treated as well.  Atrophic vaginitis may be treated with an estrogen cream, pill, suppository, or vaginal ring. If vaginal dryness occurs, lubricants and moisturizing creams may help. You may be told to avoid scented soaps, sprays, or douches.  Allergic vaginitis treatment involves quitting the use of the product that is causing the problem. Vaginal creams can be used to treat the symptoms. HOME CARE INSTRUCTIONS   Take all medicines as directed by your caregiver.  Keep your genital area clean and dry. Avoid soap and only rinse the area with water.  Avoid douching. It can remove the healthy bacteria in the vagina.  Do not use tampons or have sexual intercourse until your vaginitis has been treated. Use sanitary pads while you have vaginitis.  Wipe from front to back. This avoids the spread of bacteria from the rectum to the vagina.  Let air reach your genital area.  Wear cotton underwear to decrease moisture buildup.  Avoid wearing underwear while you sleep until your vaginitis is gone.  Avoid tight pants and underwear or nylons without a cotton panel.  Take off wet clothing (especially bathing suits) as soon as possible.  Use mild, non-scented products. Avoid using irritants, such as:  Scented feminine sprays.  Fabric softeners.  Scented detergents.  Scented tampons.  Scented soaps or bubble baths.  Practice safe sex and use condoms. Condoms may prevent the spread of trichomoniasis and viral vaginitis. SEEK MEDICAL CARE IF:   You have abdominal pain.  You  have a fever or persistent symptoms for more than 2-3 days.  You have a fever and your symptoms suddenly  get worse. Document Released: 02/23/2007 Document Revised: 01/21/2012 Document Reviewed: 10/09/2011 [facility] Patient Information 2015 [place], [place]. This information is not intended to replace advice given to you by your health care provider. Make sure you discuss any questions you have with your health care provider.  Atrophic Vaginitis Atrophic vaginitis is a problem of low levels of estrogen in women. This problem can happen at any age. It is most common in women who have gone through menopause ("the change").  HOW WILL I KNOW IF I HAVE THIS PROBLEM? You may have:  Trouble with peeing (urinating), such as:  Going to the bathroom often.  A hard time holding your pee until you reach a bathroom.  Leaking pee.  Having pain when you pee.  Itching or a burning feeling.  Vaginal bleeding and spotting.  Pain during sex.  Dryness of the vagina.  A yellow, bad-smelling fluid (discharge) coming from the vagina. HOW WILL MY DOCTOR CHECK FOR THIS PROBLEM?  During your exam, your doctor will likely find the problem.  If there is a vaginal fluid, it may be checked for infection. HOW WILL THIS PROBLEM BE TREATED? Keep the vulvar skin as clean as possible. Moisturizers and lubricants can help with some of the symptoms. Estrogen replacement can help. There are 2 ways to take estrogen:  Systemic estrogen gets estrogen to your whole body. It takes many weeks or months before the symptoms get better.  You take an estrogen pill.  You use a skin patch. This is a patch that you put on your skin.  If you still have your uterus, your doctor may ask you to take a hormone. Talk to your doctor about the right medicine for you.  Estrogen cream.  This puts estrogen only at the part of your body where you apply it. The cream is put into the vagina or put on the vulvar skin. For some women, estrogen cream works faster than pills or the patch. CAN ALL WOMEN WITH THIS PROBLEM USE ESTROGEN? No. Women  with certain types of cancer, liver problems, or problems with blood clots should not take estrogen. Your doctor can help you decide the best treatment for your symptoms. Document Released: 10/15/2007 Document Revised: 05/03/2013 Document Reviewed: 10/15/2007 [facility] Patient Information 2015 [place], [place]. This information is not intended to replace advice given to you by your health care provider. Make sure you discuss any questions you have with your health care provider.

## 2014-06-10 NOTE — ED Provider Notes (Signed)
CSN: [id number]     Arrival date & time 06/10/14  1700 History   First MD Initiated Contact with Patient 06/10/14 1811     Chief Complaint  Patient presents with  . Vaginal Itching   (Consider location/radiation/quality/duration/timing/severity/associated sxs/prior Treatment) HPI Comments: 69 year old female presents with vaginal irritation and discharge. She denies urinary symptoms. She was seen at a local hospital with diagnosis of UTI just a little over week ago and treated with an antibiotic. In the last couple of days she is started if the vaginal symptoms. Denies pelvic pain.   Past Medical History  Diagnosis Date  . Hypertension   . Vertigo    History reviewed. No pertinent past surgical history. History reviewed. No pertinent family history. History  Substance Use Topics  . Smoking status: Never Smoker   . Smokeless tobacco: Not on file  . Alcohol Use: No   OB History    No data available     Review of Systems  Constitutional: Negative.   Genitourinary: Positive for vaginal discharge and vaginal pain. Negative for dysuria, urgency, frequency, vaginal bleeding, difficulty urinating, menstrual problem and pelvic pain.  Neurological: Negative.     Allergies  Codeine; Vytorin; and Lipitor  Home Medications   Prior to Admission medications   Medication Sig Start Date End Date Taking? Authorizing Provider  amLODipine (NORVASC) 5 MG tablet Take 2 tablets (10 mg total) by mouth daily. 06/01/14  Yes Maudry Diego, MD  simvastatin (ZOCOR) 40 MG tablet Take 0.5 tablets (20 mg total) by mouth daily. 06/01/14  Yes Maudry Diego, MD  cephALEXin (KEFLEX) 500 MG capsule Take 1 capsule (500 mg total) by mouth 4 (four) times daily. 06/01/14   Maudry Diego, MD  diazepam (VALIUM) 5 MG tablet Take 1 tablet (5 mg total) by mouth every 8 (eight) hours as needed for anxiety (spasms). 06/01/14   Maudry Diego, MD  fluconazole (DIFLUCAN) 150 MG tablet 1 tab po x 1. May repeat in 72  hours if no improvement 06/10/14   Janne Napoleon, NP  hydroxypropyl methylcellulose (ISOPTO TEARS) 2.5 % ophthalmic solution Place 2 drops into both eyes daily as needed for dry eyes (dry eyes).     Historical Provider, MD  meclizine (ANTIVERT) 50 MG tablet Take 0.5 tablets (25 mg total) by mouth 3 (three) times daily as needed. 02/08/14   Charlesetta Shanks, MD  metroNIDAZOLE (FLAGYL) 500 MG tablet Take 1 tablet (500 mg total) by mouth 2 (two) times daily. X 7 days 06/10/14   Janne Napoleon, NP  Miscellaneous Vaginal Products ([place]) Place vaginally once.    Historical Provider, MD   BP 115/76 mmHg  Pulse 95  Temp(Src) 98.3 F (36.8 C) (Oral)  Resp 12  SpO2 100% Physical Exam  Constitutional: She is oriented to person, place, and time. She appears well-developed and well-nourished. No distress.  Neck: Normal range of motion. Neck supple.  Pulmonary/Chest: Effort normal. No respiratory distress.  Abdominal: Soft. There is no tenderness.  Genitourinary: Vaginal discharge found.  Mucosal aspect of the volar is mildly erythematous. There is moderate to copious amount of thick green discharge within the vaginal vault. Initial introduction of the speculum reveals a small cystocele. Cervix is posterior and erythematous. Collection of swab specimen produces bleeding. Cervix friable. No tenderness. Vaginal walls are atrophied.  Neurological: She is alert and oriented to person, place, and time.  Skin: Skin is warm and dry.  Psychiatric: She has a normal mood and affect.  Nursing note and vitals reviewed.   ED Course  Procedures (including critical care time) Labs Review Labs Reviewed  CERVICOVAGINAL ANCILLARY ONLY   Results for orders placed or performed during the hospital encounter of 06/01/14  Urinalysis, Routine w reflex microscopic  Result Value Ref Range   Color, Urine YELLOW YELLOW   APPearance CLOUDY (A) CLEAR   Specific Gravity, Urine 1.006 1.005 - 1.030   pH 5.5 5.0 -  8.0   Glucose, UA NEGATIVE NEGATIVE mg/dL   Hgb urine dipstick TRACE (A) NEGATIVE   Bilirubin Urine NEGATIVE NEGATIVE   Ketones, ur NEGATIVE NEGATIVE mg/dL   Protein, ur NEGATIVE NEGATIVE mg/dL   Urobilinogen, UA 0.2 0.0 - 1.0 mg/dL   Nitrite NEGATIVE NEGATIVE   Leukocytes, UA LARGE (A) NEGATIVE  Urine microscopic-add on  Result Value Ref Range   Squamous Epithelial / LPF FEW (A) RARE   WBC, UA 21-50 <3 WBC/hpf   RBC / HPF 0-2 <3 RBC/hpf   Bacteria, UA RARE RARE   Urine-Other MUCOUS PRESENT      Imaging Review No results found.   MDM   1. Vaginal discharge   2. Vaginitis   3. Friable cervix   4. Postmenopausal atrophic vaginitis   5. Cystocele, midline   6. BV (bacterial vaginosis)    Flagyl 500 mg twice a day. No alcohol Diflucan 150 mg. No acute tablets. While taking Diflucan stop your cholesterol medicine, simvastatin. Cytology pending. Follow-up with your PCP to discuss postmenopausal vaginal atrophy    Janne Napoleon, NP 06/10/14 1839

## 2014-06-12 LAB — CERVICOVAGINAL ANCILLARY ONLY
CHLAMYDIA, DNA PROBE: NEGATIVE
NEISSERIA GONORRHEA: NEGATIVE
WET PREP (BD AFFIRM): NEGATIVE
WET PREP (BD AFFIRM): NEGATIVE
WET PREP (BD AFFIRM): POSITIVE — AB

## 2014-06-15 ENCOUNTER — Telehealth (HOSPITAL_COMMUNITY): Payer: Self-pay | Admitting: *Deleted

## 2014-06-15 NOTE — ED Notes (Addendum)
Pt. called and asked for her lab results.  Pt. verified x 2 and given results.  Pt. told that she was adequately treated for Trich and to finish all of the Flagyl.  Pt. instructed to notify her partner, no sex until she finishes her medication and her partner has been treated and to practice safe sex. Pt. told she can get HIV testing at the [facility]. STD clinic, by appointment.  Pt. voiced understanding. Roselyn Meier 06/15/2014

## 2014-06-16 ENCOUNTER — Encounter (HOSPITAL_COMMUNITY): Payer: Self-pay | Admitting: Emergency Medicine

## 2014-06-16 ENCOUNTER — Emergency Department (INDEPENDENT_AMBULATORY_CARE_PROVIDER_SITE_OTHER)
Admission: EM | Admit: 2014-06-16 | Discharge: 2014-06-16 | Disposition: A | Discharge status: Home / Self Care | Payer: Medicare Other | Source: Home / Self Care | Attending: Family Medicine | Admitting: Family Medicine

## 2014-06-16 DIAGNOSIS — T192XXA Foreign body in vulva and vagina, initial encounter: Secondary | ICD-10-CM | POA: Diagnosis not present

## 2014-06-16 NOTE — Discharge Instructions (Signed)
Use cleansing douche tonight , see your doctor as needed.

## 2014-06-16 NOTE — ED Notes (Signed)
Pt states she has a condom stuck in her vagina from intercourse last night.

## 2014-06-16 NOTE — ED Provider Notes (Signed)
CSN: [id number]     Arrival date & time 06/16/14  1422 History   First MD Initiated Contact with Patient 06/16/14 1512     Chief Complaint  Patient presents with  . Abdominal Pain   (Consider location/radiation/quality/duration/timing/severity/associated sxs/prior Treatment) Patient is a 69 y.o. female presenting with foreign body in vagina. The history is provided by the patient.  Foreign Body in Vagina This is a new problem. The current episode started yesterday. The problem has not changed since onset.Pertinent negatives include no abdominal pain.    Past Medical History  Diagnosis Date  . Hypertension   . Vertigo    History reviewed. No pertinent past surgical history. History reviewed. No pertinent family history. History  Substance Use Topics  . Smoking status: Never Smoker   . Smokeless tobacco: Not on file  . Alcohol Use: No   OB History    No data available     Review of Systems  Constitutional: Negative.   Gastrointestinal: Negative.  Negative for abdominal pain.  Genitourinary: Negative.  Negative for vaginal bleeding and vaginal discharge.    Allergies  Codeine; Vytorin; and Lipitor  Home Medications   Prior to Admission medications   Medication Sig Start Date End Date Taking? Authorizing Provider  amLODipine (NORVASC) 5 MG tablet Take 2 tablets (10 mg total) by mouth daily. 06/01/14  Yes Maudry Diego, MD  cephALEXin (KEFLEX) 500 MG capsule Take 1 capsule (500 mg total) by mouth 4 (four) times daily. 06/01/14  Yes Maudry Diego, MD  diazepam (VALIUM) 5 MG tablet Take 1 tablet (5 mg total) by mouth every 8 (eight) hours as needed for anxiety (spasms). 06/01/14  Yes Maudry Diego, MD  fluconazole (DIFLUCAN) 150 MG tablet 1 tab po x 1. May repeat in 72 hours if no improvement 06/10/14  Yes Janne Napoleon, NP  hydroxypropyl methylcellulose (ISOPTO TEARS) 2.5 % ophthalmic solution Place 2 drops into both eyes daily as needed for dry eyes (dry eyes).    Yes  Historical Provider, MD  meclizine (ANTIVERT) 50 MG tablet Take 0.5 tablets (25 mg total) by mouth 3 (three) times daily as needed. 02/08/14  Yes Charlesetta Shanks, MD  metroNIDAZOLE (FLAGYL) 500 MG tablet Take 1 tablet (500 mg total) by mouth 2 (two) times daily. X 7 days 06/10/14  Yes Janne Napoleon, NP  Miscellaneous Vaginal Products ([place]) Place vaginally once.   Yes Historical Provider, MD  simvastatin (ZOCOR) 40 MG tablet Take 0.5 tablets (20 mg total) by mouth daily. 06/01/14  Yes Maudry Diego, MD   BP 131/64 mmHg  Pulse 101  Temp(Src) 99.3 F (37.4 C) (Oral)  SpO2 99% Physical Exam  Constitutional: She is oriented to person, place, and time. She appears well-developed and well-nourished. No distress.  Abdominal: Soft. Bowel sounds are normal. There is no tenderness.  Genitourinary: No vaginal discharge found.  Condom present in vagina, removed without diff with ring forceps., no odor or bleeding.  Neurological: She is alert and oriented to person, place, and time.  Skin: Skin is warm and dry.  Nursing note and vitals reviewed.   ED Course  Procedures (including critical care time) Labs Review Labs Reviewed - No data to display  Imaging Review No results found.   MDM   1. Foreign body in vagina, initial encounter        Billy Fischer, MD 06/16/14 [phone]

## 2014-08-03 ENCOUNTER — Emergency Department (HOSPITAL_COMMUNITY)
Admission: EM | Admit: 2014-08-03 | Discharge: 2014-08-03 | Disposition: A | Discharge status: Home / Self Care | Payer: Medicare Other | Attending: Emergency Medicine | Admitting: Emergency Medicine

## 2014-08-03 ENCOUNTER — Encounter (HOSPITAL_COMMUNITY): Payer: Self-pay

## 2014-08-03 DIAGNOSIS — R109 Unspecified abdominal pain: Secondary | ICD-10-CM

## 2014-08-03 DIAGNOSIS — Z792 Long term (current) use of antibiotics: Secondary | ICD-10-CM | POA: Insufficient documentation

## 2014-08-03 DIAGNOSIS — H578 Other specified disorders of eye and adnexa: Secondary | ICD-10-CM | POA: Diagnosis not present

## 2014-08-03 DIAGNOSIS — Z79899 Other long term (current) drug therapy: Secondary | ICD-10-CM | POA: Insufficient documentation

## 2014-08-03 DIAGNOSIS — R1030 Lower abdominal pain, unspecified: Secondary | ICD-10-CM | POA: Diagnosis not present

## 2014-08-03 DIAGNOSIS — I10 Essential (primary) hypertension: Secondary | ICD-10-CM | POA: Diagnosis not present

## 2014-08-03 LAB — BASIC METABOLIC PANEL
Anion gap: 8 (ref 5–15)
BUN: 9 mg/dL (ref 6–23)
CHLORIDE: 106 mmol/L (ref 96–112)
CO2: 28 mmol/L (ref 19–32)
Calcium: 9.2 mg/dL (ref 8.4–10.5)
Creatinine, Ser: 0.81 mg/dL (ref 0.50–1.10)
GFR calc Af Amer: 85 mL/min — ABNORMAL LOW (ref >90)
GFR calc non Af Amer: 73 mL/min — ABNORMAL LOW (ref >90)
GLUCOSE: 115 mg/dL — AB (ref 70–99)
POTASSIUM: 4.3 mmol/L (ref 3.5–5.1)
Sodium: 142 mmol/L (ref 135–145)

## 2014-08-03 LAB — URINALYSIS, ROUTINE W REFLEX MICROSCOPIC
BILIRUBIN URINE: NEGATIVE
Glucose, UA: NEGATIVE mg/dL
Hgb urine dipstick: NEGATIVE
Ketones, ur: NEGATIVE mg/dL
Nitrite: NEGATIVE
Protein, ur: NEGATIVE mg/dL
Specific Gravity, Urine: 1.009 (ref 1.005–1.030)
UROBILINOGEN UA: 0.2 mg/dL (ref 0.0–1.0)
pH: 5 (ref 5.0–8.0)

## 2014-08-03 LAB — CBC WITH DIFFERENTIAL/PLATELET
Basophils Absolute: 0 10*3/uL (ref 0.0–0.1)
Basophils Relative: 1 % (ref 0–1)
EOS PCT: 3 % (ref 0–5)
Eosinophils Absolute: 0.1 10*3/uL (ref 0.0–0.7)
HEMATOCRIT: 35.2 % — AB (ref 36.0–46.0)
Hemoglobin: 11.7 g/dL — ABNORMAL LOW (ref 12.0–15.0)
LYMPHS ABS: 1.2 10*3/uL (ref 0.7–4.0)
LYMPHS PCT: 34 % (ref 12–46)
MCH: 30.5 pg (ref 26.0–34.0)
MCHC: 33.2 g/dL (ref 30.0–36.0)
MCV: 91.7 fL (ref 78.0–100.0)
MONO ABS: 0.2 10*3/uL (ref 0.1–1.0)
Monocytes Relative: 5 % (ref 3–12)
Neutro Abs: 2.1 10*3/uL (ref 1.7–7.7)
Neutrophils Relative %: 57 % (ref 43–77)
Platelets: 238 10*3/uL (ref 150–400)
RBC: 3.84 MIL/uL — AB (ref 3.87–5.11)
RDW: 12.5 % (ref 11.5–15.5)
WBC: 3.6 10*3/uL — ABNORMAL LOW (ref 4.0–10.5)

## 2014-08-03 LAB — URINE MICROSCOPIC-ADD ON

## 2014-08-03 MED ORDER — CIPROFLOXACIN HCL 0.3 % OP SOLN: 1.0000 [drp] | OPHTHALMIC | Status: DC

## 2014-08-03 NOTE — ED Provider Notes (Addendum)
CSN: [id number]     Arrival date & time 08/03/14  7017 History   First MD Initiated Contact with Patient 08/03/14 1007     Chief Complaint  Patient presents with  . Abdominal Pain      HPI Patient resisted emergency department complaining of mild lower abdominal discomfort of the past 2-3 days.  She states is worse with movement.  She was seen by a physician approximate 6-8 weeks ago after a condom was removed from her vagina and she was treated for a pelvic infection at that time.  She states no longer showing vaginal discharge.  She denies fevers and chills.  No dysuria or urinary frequency.  She denies back pain or flank pain.  She reports her pain is constant and only mild in severity.   Past Medical History  Diagnosis Date  . Hypertension   . Vertigo    History reviewed. No pertinent past surgical history. No family history on file. History  Substance Use Topics  . Smoking status: Never Smoker   . Smokeless tobacco: Not on file  . Alcohol Use: No   OB History    No data available     Review of Systems  All other systems reviewed and are negative.     Allergies  Codeine; Vytorin; and Lipitor  Home Medications   Prior to Admission medications   Medication Sig Start Date End Date Taking? Authorizing Provider  amLODipine (NORVASC) 5 MG tablet Take 2 tablets (10 mg total) by mouth daily. Patient taking differently: Take 5 mg by mouth daily.  06/01/14  Yes Milton Ferguson, MD  diazepam (VALIUM) 5 MG tablet Take 1 tablet (5 mg total) by mouth every 8 (eight) hours as needed for anxiety (spasms). 06/01/14  Yes Milton Ferguson, MD  hydroxypropyl methylcellulose (ISOPTO TEARS) 2.5 % ophthalmic solution Place 2 drops into both eyes daily as needed for dry eyes (dry eyes).    Yes Historical Provider, MD  meclizine (ANTIVERT) 50 MG tablet Take 0.5 tablets (25 mg total) by mouth 3 (three) times daily as needed. 02/08/14  Yes Charlesetta Shanks, MD  simvastatin (ZOCOR) 40 MG tablet Take  0.5 tablets (20 mg total) by mouth daily. Patient taking differently: Take 40 mg by mouth daily at 6 PM.  06/01/14  Yes Milton Ferguson, MD  cephALEXin (KEFLEX) 500 MG capsule Take 1 capsule (500 mg total) by mouth 4 (four) times daily. Patient not taking: Reported on 08/03/2014 06/01/14   Milton Ferguson, MD  fluconazole (DIFLUCAN) 150 MG tablet 1 tab po x 1. May repeat in 72 hours if no improvement Patient not taking: Reported on 08/03/2014 06/10/14   Janne Napoleon, NP  metroNIDAZOLE (FLAGYL) 500 MG tablet Take 1 tablet (500 mg total) by mouth 2 (two) times daily. X 7 days Patient not taking: Reported on 08/03/2014 06/10/14   Janne Napoleon, NP   BP 130/76 mmHg  Pulse 74  Temp(Src) 98 F (36.7 C) (Oral)  Resp 14  SpO2 100% Physical Exam  Constitutional: She is oriented to person, place, and time. She appears well-developed and well-nourished. No distress.  HENT:  Head: Normocephalic and atraumatic.  Eyes: EOM are normal.  Neck: Normal range of motion.  Cardiovascular: Normal rate, regular rhythm and normal heart sounds.   Pulmonary/Chest: Effort normal and breath sounds normal.  Abdominal: Soft. She exhibits no distension. There is no tenderness.  Musculoskeletal: Normal range of motion.  Neurological: She is alert and oriented to person, place, and time.  Skin: Skin is  warm and dry.  Psychiatric: She has a normal mood and affect. Judgment normal.  Nursing note and vitals reviewed.   ED Course  Procedures (including critical care time) Labs Review Labs Reviewed  CBC WITH DIFFERENTIAL/PLATELET - Abnormal; Notable for the following:    WBC 3.6 (*)    RBC 3.84 (*)    Hemoglobin 11.7 (*)    HCT 35.2 (*)    All other components within normal limits  BASIC METABOLIC PANEL - Abnormal; Notable for the following:    Glucose, Bld 115 (*)    GFR calc non Af Amer 73 (*)    GFR calc Af Amer 85 (*)    All other components within normal limits  URINALYSIS, ROUTINE W REFLEX MICROSCOPIC - Abnormal;  Notable for the following:    APPearance CLOUDY (*)    Leukocytes, UA TRACE (*)    All other components within normal limits  URINE MICROSCOPIC-ADD ON    Imaging Review No results found.   EKG Interpretation None      MDM   Final diagnoses:  None    3:58 PM She has no vaginal complaints at this time.  Labs and urinalysis are without abnormalities. Discharge home with primary care follow-up. Repeat abdominal exam is benign.  No indication for advanced imaging at this time.  Vital signs are normal.  4:30 PM At the end of his the patient also reports that she's been having mild discomfort in her left arm reports some left eye irritation without visual changes.  She reports some crusting in the morning.  She has mild conjunctivitis.  She does not wear contact lenses.  Her vision is normal however left eye.  Prescribed Cipro drops and asked to follow-up with her primary care doctor.  Jola Schmidt, MD 08/03/14 [place], MD 08/03/14 [phone]

## 2014-08-03 NOTE — ED Notes (Signed)
Pt presents with c/o lower abdominal pain that started a few days ago. Pt denies any N/V/D. Pt ambulatory to room.

## 2014-10-05 ENCOUNTER — Emergency Department (HOSPITAL_COMMUNITY): Payer: Medicare Other

## 2014-10-05 ENCOUNTER — Encounter (HOSPITAL_COMMUNITY): Payer: Self-pay | Admitting: Emergency Medicine

## 2014-10-05 ENCOUNTER — Emergency Department (HOSPITAL_COMMUNITY)
Admission: EM | Admit: 2014-10-05 | Discharge: 2014-10-05 | Disposition: A | Discharge status: Home / Self Care | Payer: Medicare Other | Attending: Emergency Medicine | Admitting: Emergency Medicine

## 2014-10-05 DIAGNOSIS — Z79899 Other long term (current) drug therapy: Secondary | ICD-10-CM | POA: Diagnosis not present

## 2014-10-05 DIAGNOSIS — Z792 Long term (current) use of antibiotics: Secondary | ICD-10-CM | POA: Insufficient documentation

## 2014-10-05 DIAGNOSIS — R079 Chest pain, unspecified: Secondary | ICD-10-CM | POA: Diagnosis not present

## 2014-10-05 DIAGNOSIS — I10 Essential (primary) hypertension: Secondary | ICD-10-CM | POA: Insufficient documentation

## 2014-10-05 DIAGNOSIS — R0781 Pleurodynia: Secondary | ICD-10-CM | POA: Insufficient documentation

## 2014-10-05 DIAGNOSIS — R071 Chest pain on breathing: Secondary | ICD-10-CM | POA: Diagnosis not present

## 2014-10-05 LAB — URINALYSIS, ROUTINE W REFLEX MICROSCOPIC
GLUCOSE, UA: NEGATIVE mg/dL
Hgb urine dipstick: NEGATIVE
KETONES UR: NEGATIVE mg/dL
NITRITE: NEGATIVE
PH: 5 (ref 5.0–8.0)
Protein, ur: NEGATIVE mg/dL
Specific Gravity, Urine: 1.022 (ref 1.005–1.030)
UROBILINOGEN UA: 0.2 mg/dL (ref 0.0–1.0)

## 2014-10-05 LAB — URINE MICROSCOPIC-ADD ON

## 2014-10-05 MED ORDER — IBUPROFEN 800 MG PO TABS
800.0000 mg | ORAL_TABLET | Freq: Once | ORAL | Status: AC
  Administered 2014-10-05: 800 mg via ORAL
  Filled 2014-10-05: qty 1

## 2014-10-05 MED ORDER — IBUPROFEN 600 MG PO TABS: 600.0000 mg | ORAL_TABLET | Freq: Three times a day (TID) | ORAL | Status: DC | PRN

## 2014-10-05 NOTE — ED Provider Notes (Signed)
CSN: [id number]     Arrival date & time 10/05/14  1057 History   First MD Initiated Contact with Patient 10/05/14 1145     Chief Complaint  Patient presents with  . Left Side Pain       HPI Patient presents emergency department complaining of pleuritic left-sided chest pain is worse with movement and deep breaths.  States his been here for several days.  She denies shortness of breath.  No diarrhea or vomiting.  She reports occasional intermittent nausea.  No nausea at this time.  No recent injury or trauma.  No fevers or chills.  No history DVT or pulmonary embolism.  No shortness of breath.  No exertional shortness of breath.   Past Medical History  Diagnosis Date  . Hypertension   . Vertigo    History reviewed. No pertinent past surgical history. History reviewed. No pertinent family history. History  Substance Use Topics  . Smoking status: Never Smoker   . Smokeless tobacco: Not on file  . Alcohol Use: No   OB History    No data available     Review of Systems  All other systems reviewed and are negative.     Allergies  Codeine; Vytorin; and Lipitor  Home Medications   Prior to Admission medications   Medication Sig Start Date End Date Taking? Authorizing Provider  amLODipine (NORVASC) 5 MG tablet Take 2 tablets (10 mg total) by mouth daily. Patient taking differently: Take 5 mg by mouth daily.  06/01/14  Yes Milton Ferguson, MD  diazepam (VALIUM) 5 MG tablet Take 1 tablet (5 mg total) by mouth every 8 (eight) hours as needed for anxiety (spasms). 06/01/14  Yes Milton Ferguson, MD  hydroxypropyl methylcellulose (ISOPTO TEARS) 2.5 % ophthalmic solution Place 2 drops into both eyes daily as needed for dry eyes (dry eyes).    Yes Historical Provider, MD  meclizine (ANTIVERT) 50 MG tablet Take 0.5 tablets (25 mg total) by mouth 3 (three) times daily as needed. Patient taking differently: Take 25 mg by mouth 3 (three) times daily as needed for dizziness or nausea.  02/08/14   Yes Charlesetta Shanks, MD  simvastatin (ZOCOR) 40 MG tablet Take 0.5 tablets (20 mg total) by mouth daily. Patient taking differently: Take 40 mg by mouth daily at 6 PM.  06/01/14  Yes Milton Ferguson, MD  cephALEXin (KEFLEX) 500 MG capsule Take 1 capsule (500 mg total) by mouth 4 (four) times daily. Patient not taking: Reported on 08/03/2014 06/01/14   Milton Ferguson, MD  ciprofloxacin (CILOXAN) 0.3 % ophthalmic solution Place 1 drop into the left eye every 4 (four) hours while awake. Administer 1 drop, every 2 hours, while awake, for 2 days. Then 1 drop, every 4 hours, while awake, for the next 5 days. Patient not taking: Reported on 10/05/2014 08/03/14   Jola Schmidt, MD  fluconazole (DIFLUCAN) 150 MG tablet 1 tab po x 1. May repeat in 72 hours if no improvement Patient not taking: Reported on 08/03/2014 06/10/14   Janne Napoleon, NP  metroNIDAZOLE (FLAGYL) 500 MG tablet Take 1 tablet (500 mg total) by mouth 2 (two) times daily. X 7 days Patient not taking: Reported on 08/03/2014 06/10/14   Janne Napoleon, NP   BP 119/83 mmHg  Pulse 92  Temp(Src) 98 F (36.7 C) (Oral)  Resp 18  SpO2 99% Physical Exam  Constitutional: She is oriented to person, place, and time. She appears well-developed and well-nourished. No distress.  HENT:  Head: Normocephalic and atraumatic.  Eyes: EOM are normal.  Neck: Normal range of motion.  Cardiovascular: Normal rate, regular rhythm and normal heart sounds.   Pulmonary/Chest: Effort normal and breath sounds normal.  Abdominal: Soft. She exhibits no distension. There is no tenderness.  Musculoskeletal: Normal range of motion.  Neurological: She is alert and oriented to person, place, and time.  Skin: Skin is warm and dry.  Psychiatric: She has a normal mood and affect. Judgment normal.  Nursing note and vitals reviewed.   ED Course  Procedures (including critical care time) Labs Review Labs Reviewed  URINALYSIS, ROUTINE W REFLEX MICROSCOPIC (NOT AT [facility]) - Abnormal;  Notable for the following:    Color, Urine AMBER (*)    APPearance CLOUDY (*)    Bilirubin Urine SMALL (*)    Leukocytes, UA SMALL (*)    All other components within normal limits  URINE MICROSCOPIC-ADD ON    Imaging Review Dg Chest 2 View  10/05/2014   CLINICAL DATA:  Left lateral chest pain  EXAM: CHEST  2 VIEW  COMPARISON:  None.  FINDINGS: The heart size and mediastinal contours are within normal limits. Both lungs are clear. The visualized skeletal structures are unremarkable.  IMPRESSION: No active cardiopulmonary disease.   Electronically Signed   By: Inez Catalina M.D.   On: 10/05/2014 12:41  I personally reviewed the imaging tests through PACS system I reviewed available ER/hospitalization records through the EMR    EKG Interpretation None      MDM   Final diagnoses:  Pleuritic chest pain    Atypical pleuritic chest pain.  Doubt pulmonary embolism based on history.  Chest x-ray is clear.  Discharge home in good condition.  Primary care follow-up.  Home with anti-inflammatories.    Jola Schmidt, MD 10/05/14 [phone]

## 2014-10-05 NOTE — Discharge Instructions (Signed)

## 2014-10-05 NOTE — ED Notes (Signed)
Pt c/o nausea and left side pain, worsens with movement and deep breaths. Pt denies emesis, diarrhea.

## 2014-11-06 DIAGNOSIS — I10 Essential (primary) hypertension: Secondary | ICD-10-CM | POA: Diagnosis not present

## 2014-11-10 DIAGNOSIS — I10 Essential (primary) hypertension: Secondary | ICD-10-CM | POA: Diagnosis not present

## 2014-11-14 DIAGNOSIS — H47233 Glaucomatous optic atrophy, bilateral: Secondary | ICD-10-CM | POA: Diagnosis not present

## 2014-11-14 DIAGNOSIS — H527 Unspecified disorder of refraction: Secondary | ICD-10-CM | POA: Diagnosis not present

## 2014-11-14 DIAGNOSIS — H524 Presbyopia: Secondary | ICD-10-CM | POA: Diagnosis not present

## 2014-11-14 DIAGNOSIS — H269 Unspecified cataract: Secondary | ICD-10-CM | POA: Diagnosis not present

## 2014-11-17 DIAGNOSIS — Z1231 Encounter for screening mammogram for malignant neoplasm of breast: Secondary | ICD-10-CM | POA: Diagnosis not present

## 2014-11-22 DIAGNOSIS — N390 Urinary tract infection, site not specified: Secondary | ICD-10-CM | POA: Diagnosis not present

## 2014-11-22 DIAGNOSIS — Z01419 Encounter for gynecological examination (general) (routine) without abnormal findings: Secondary | ICD-10-CM | POA: Diagnosis not present

## 2014-12-14 DIAGNOSIS — R220 Localized swelling, mass and lump, head: Secondary | ICD-10-CM | POA: Diagnosis not present

## 2014-12-19 DIAGNOSIS — N390 Urinary tract infection, site not specified: Secondary | ICD-10-CM | POA: Diagnosis not present

## 2014-12-19 DIAGNOSIS — Z01419 Encounter for gynecological examination (general) (routine) without abnormal findings: Secondary | ICD-10-CM | POA: Diagnosis not present

## 2014-12-19 DIAGNOSIS — L309 Dermatitis, unspecified: Secondary | ICD-10-CM | POA: Diagnosis not present

## 2014-12-20 DIAGNOSIS — G629 Polyneuropathy, unspecified: Secondary | ICD-10-CM | POA: Diagnosis not present

## 2015-03-06 ENCOUNTER — Emergency Department (HOSPITAL_COMMUNITY)
Admission: EM | Admit: 2015-03-06 | Discharge: 2015-03-06 | Disposition: A | Discharge status: Home / Self Care | Payer: Medicare (Managed Care) | Attending: Emergency Medicine | Admitting: Emergency Medicine

## 2015-03-06 ENCOUNTER — Emergency Department (HOSPITAL_COMMUNITY): Payer: Medicare (Managed Care)

## 2015-03-06 ENCOUNTER — Encounter (HOSPITAL_COMMUNITY): Payer: Self-pay | Admitting: Emergency Medicine

## 2015-03-06 DIAGNOSIS — Y9389 Activity, other specified: Secondary | ICD-10-CM | POA: Insufficient documentation

## 2015-03-06 DIAGNOSIS — Y9289 Other specified places as the place of occurrence of the external cause: Secondary | ICD-10-CM | POA: Insufficient documentation

## 2015-03-06 DIAGNOSIS — Z79899 Other long term (current) drug therapy: Secondary | ICD-10-CM | POA: Insufficient documentation

## 2015-03-06 DIAGNOSIS — W231XXA Caught, crushed, jammed, or pinched between stationary objects, initial encounter: Secondary | ICD-10-CM | POA: Insufficient documentation

## 2015-03-06 DIAGNOSIS — E7800 Pure hypercholesterolemia, unspecified: Secondary | ICD-10-CM | POA: Diagnosis not present

## 2015-03-06 DIAGNOSIS — I10 Essential (primary) hypertension: Secondary | ICD-10-CM | POA: Diagnosis not present

## 2015-03-06 DIAGNOSIS — S99921A Unspecified injury of right foot, initial encounter: Secondary | ICD-10-CM | POA: Diagnosis present

## 2015-03-06 DIAGNOSIS — Y998 Other external cause status: Secondary | ICD-10-CM | POA: Insufficient documentation

## 2015-03-06 DIAGNOSIS — S91209A Unspecified open wound of unspecified toe(s) with damage to nail, initial encounter: Secondary | ICD-10-CM

## 2015-03-06 DIAGNOSIS — S91201A Unspecified open wound of right great toe with damage to nail, initial encounter: Secondary | ICD-10-CM | POA: Insufficient documentation

## 2015-03-06 HISTORY — DX: Pure hypercholesterolemia, unspecified: E78.00

## 2015-03-06 MED ORDER — LIDOCAINE HCL 1 % IJ SOLN
5.0000 mL | Freq: Once | INTRAMUSCULAR | Status: AC
  Administered 2015-03-06: 5 mL
  Filled 2015-03-06: qty 20

## 2015-03-06 MED ORDER — ACETAMINOPHEN 325 MG PO TABS
650.0000 mg | ORAL_TABLET | Freq: Once | ORAL | Status: AC
  Administered 2015-03-06: 650 mg via ORAL
  Filled 2015-03-06: qty 2

## 2015-03-06 MED ORDER — HYDROCODONE-ACETAMINOPHEN 5-325 MG PO TABS: 1.0000 | ORAL_TABLET | ORAL | Status: DC | PRN

## 2015-03-06 MED ORDER — TETANUS-DIPHTH-ACELL PERTUSSIS 5-2.5-18.5 LF-MCG/0.5 IM SUSP
0.5000 mL | Freq: Once | INTRAMUSCULAR | Status: AC
  Administered 2015-03-06: 0.5 mL via INTRAMUSCULAR
  Filled 2015-03-06: qty 0.5

## 2015-03-06 MED ORDER — IBUPROFEN 200 MG PO TABS
600.0000 mg | ORAL_TABLET | Freq: Once | ORAL | Status: DC
  Filled 2015-03-06: qty 3

## 2015-03-06 MED ORDER — TETANUS-DIPHTH-ACELL PERTUSSIS 5-2.5-18.5 LF-MCG/0.5 IM SUSP: 0.5000 mL | Freq: Once | INTRAMUSCULAR | Status: DC

## 2015-03-06 MED ORDER — BUPIVACAINE HCL (PF) 0.5 % IJ SOLN
10.0000 mL | Freq: Once | INTRAMUSCULAR | Status: AC
  Administered 2015-03-06: 10 mL
  Filled 2015-03-06: qty 30

## 2015-03-06 NOTE — Discharge Instructions (Signed)
Fingernail or Toenail Removal, Care After Refer to this sheet in the next few weeks. These instructions provide you with information about caring for yourself after your procedure. Your health care provider may also give you more specific instructions. Your treatment has been planned according to current medical practices, but problems sometimes occur. Call your health care provider if you have any problems or questions after your procedure. WHAT TO EXPECT AFTER THE PROCEDURE After your procedure, it is common to have:  Redness.  Swelling. HOME CARE INSTRUCTIONS  If you have a splint on your finger:  Wear it as directed by your health care provider. Remove it only as directed by your health care provider.  Loosen the splint if your fingers become numb and tingle, or if they turn cold and blue.  If you were given a surgical shoe, wear it as directed by your health care provider.  Take medicines only as directed by your health care provider.  Elevate your hand or foot as much of the time as possible. This helps with pain and swelling.  If you are recovering from fingernail removal, keep your hand raised above the level of your heart.  If you are recovering from toenail removal, lie on a bed or a couch with your leg propped up on pillows, or sit in a reclining chair with the footrest up.  Follow instructions from your health care provider about bandage (dressing) changes and removal:  Change your dressing 24 hours after your procedure or as directed by your health care provider.  Soak your hand or foot in warm, soapy water for 10-20 minutes or as directed by your health care provider. Do this 3 times per day or as directed by your health care provider. This reduces pain and swelling.  After you soak your hand or foot, apply a clean, dry dressing.  Keep your dressing clean and dry. Change your dressing whenever it gets wet or dirty.  Keep all follow-up visits as directed by your  health care provider. This is important. SEEK MEDICAL CARE IF:  You have increased redness or pain at your nail area.  You have increased fluid, blood, or pus coming from your nail area.  There is a bad smell coming from the dressing.  You have a fever.  Your swelling gets worse, or you have swelling that spreads from your finger to your hand or from your toe to your foot.  You have worsening redness that spreads from your finger to your hand or from your toe up to your foot.  Your finger or toe looks blue or black.   This information is not intended to replace advice given to you by your health care provider. Make sure you discuss any questions you have with your health care provider.   Document Released: 05/19/2014 Document Reviewed: 05/19/2014 Elsevier Interactive Patient Education 2016 [place] the blue dressing in place for 48 hours than remove. Keep the nail area covered because it will be tender for a few days to a week than will toughen up and the nail will grow back in 6-8 weeks

## 2015-03-06 NOTE — ED Notes (Signed)
Pt states that about 2 hours ago she was pulling a suitcase and hit her big right toe nail causing it to tear.  Pt states that toe nail is still partially attached.

## 2015-03-06 NOTE — ED Provider Notes (Signed)
CSN: [id number]     Arrival date & time 03/06/15  1843 History  By signing my name below, I, Julien Nordmann, attest that this documentation has been prepared under the direction and in the presence of Junius Creamer, NP. Electronically Signed: Julien Nordmann, ED Scribe. 03/06/2015. 8:08 PM.    Chief Complaint  Patient presents with  . toe nail injury       The history is provided by the patient. No language interpreter was used.   HPI Comments: Susan Berg is a 69 y.o. female who presents to the Emergency Department complaining of a right toe nail injury that occurred about 2 hours ago. Pt describes her pain as throbbing. She explains that she was pulling a suitcase when it caught her right big toenail and caused it to tear. Pt reports she bumped the same toe previously before causing injury. Pt states that her toenail is partially attached.   Past Medical History  Diagnosis Date  . Hypertension   . Vertigo   . High cholesterol    History reviewed. No pertinent past surgical history. No family history on file. Social History  Substance Use Topics  . Smoking status: Never Smoker   . Smokeless tobacco: None  . Alcohol Use: No   OB History    No data available     Review of Systems  Skin: Positive for wound.  All other systems reviewed and are negative.     Allergies  Codeine; Vytorin; and Lipitor  Home Medications   Prior to Admission medications   Medication Sig Start Date End Date Taking? Authorizing Provider  amLODipine (NORVASC) 5 MG tablet Take 2 tablets (10 mg total) by mouth daily. Patient taking differently: Take 5 mg by mouth daily.  06/01/14   Milton Ferguson, MD  cephALEXin (KEFLEX) 500 MG capsule Take 1 capsule (500 mg total) by mouth 4 (four) times daily. Patient not taking: Reported on 08/03/2014 06/01/14   Milton Ferguson, MD  ciprofloxacin (CILOXAN) 0.3 % ophthalmic solution Place 1 drop into the left eye every 4 (four) hours while awake. Administer 1 drop,  every 2 hours, while awake, for 2 days. Then 1 drop, every 4 hours, while awake, for the next 5 days. Patient not taking: Reported on 10/05/2014 08/03/14   Jola Schmidt, MD  diazepam (VALIUM) 5 MG tablet Take 1 tablet (5 mg total) by mouth every 8 (eight) hours as needed for anxiety (spasms). 06/01/14   Milton Ferguson, MD  fluconazole (DIFLUCAN) 150 MG tablet 1 tab po x 1. May repeat in 72 hours if no improvement Patient not taking: Reported on 08/03/2014 06/10/14   Janne Napoleon, NP  HYDROcodone-acetaminophen (NORCO/VICODIN) 5-325 MG tablet Take 1 tablet by mouth every 4 (four) hours as needed. 03/06/15   Junius Creamer, NP  hydroxypropyl methylcellulose (ISOPTO TEARS) 2.5 % ophthalmic solution Place 2 drops into both eyes daily as needed for dry eyes (dry eyes).     Historical Provider, MD  ibuprofen (ADVIL,MOTRIN) 600 MG tablet Take 1 tablet (600 mg total) by mouth every 8 (eight) hours as needed. 10/05/14   Jola Schmidt, MD  meclizine (ANTIVERT) 50 MG tablet Take 0.5 tablets (25 mg total) by mouth 3 (three) times daily as needed. Patient taking differently: Take 25 mg by mouth 3 (three) times daily as needed for dizziness or nausea.  02/08/14   Charlesetta Shanks, MD  metroNIDAZOLE (FLAGYL) 500 MG tablet Take 1 tablet (500 mg total) by mouth 2 (two) times daily. X 7 days Patient  not taking: Reported on 08/03/2014 06/10/14   Janne Napoleon, NP  simvastatin (ZOCOR) 40 MG tablet Take 0.5 tablets (20 mg total) by mouth daily. Patient taking differently: Take 40 mg by mouth daily at 6 PM.  06/01/14   Milton Ferguson, MD   Triage vitals: BP 129/77 mmHg  Pulse 100  Temp(Src) 98.2 F (36.8 C) (Oral)  Resp 17  SpO2 100% Physical Exam  Constitutional: She appears well-developed and well-nourished. No distress.  HENT:  Head: Normocephalic and atraumatic.  Eyes: Right eye exhibits no discharge. Left eye exhibits no discharge.  Pulmonary/Chest: Effort normal. No respiratory distress.  Neurological: She is alert. Coordination  normal.  Skin: No rash noted. She is not diaphoretic.  Psychiatric: She has a normal mood and affect. Her behavior is normal.  Nursing note and vitals reviewed.   ED Course  .Nail Removal Date/Time: 03/06/2015 9:48 PM Performed by: Junius Creamer Authorized by: Junius Creamer Consent: Verbal consent obtained. Written consent not obtained. Risks and benefits: risks, benefits and alternatives were discussed Consent given by: patient Patient understanding: patient states understanding of the procedure being performed Patient identity confirmed: verbally with patient Location: right foot Location details: right big toe Anesthesia: digital block Local anesthetic: bupivacaine 0.5% without epinephrine and lidocaine 1% without epinephrine Anesthetic total: 3 ml Patient sedated: no Preparation: skin prepped with Betadine Amount removed: complete Wedge excision of skin of nail fold: no Nail bed sutured: no Nail matrix removed: none Removed nail replaced and anchored: no Dressing: dressing applied and petrolatum-impregnated gauze Patient tolerance: Patient tolerated the procedure well with no immediate complications    DIAGNOSTIC STUDIES: Oxygen Saturation is 100% on RA, normal by my interpretation.  COORDINATION OF CARE:  8:06 PM Discussed treatment plan which includes tetanus shot and take toenail off with pt at bedside and pt agreed to plan.   Labs Review Labs Reviewed - No data to display  Imaging Review Dg Toe Great Right  03/06/2015  CLINICAL DATA:  PT C/O TOENAIL BED PAIN S/P DRAGGING SUITCASE ACROSS FOOT UPWARDS FROM TOES TODAY, CAUSING LAC AND ABRASION TO TOE AND VISIBLE DISLODGEMENT OF TOENAIL EXAM: RIGHT GREAT TOE COMPARISON:  None. FINDINGS: There is no evidence of fracture or dislocation. Corticated ossicles medial to the head of the first metatarsal. There is no other evidence of arthropathy or other focal bone abnormality. No radiodense foreign body or subcutaneous gas.  Soft tissues are unremarkable. IMPRESSION: 1. Negative for fracture or other acute bone abnormality. Electronically Signed   By: Lucrezia Europe M.D.   On: 03/06/2015 19:53   I have personally reviewed and evaluated these images and lab results as part of my medical decision-making.   EKG Interpretation None     Patient presented with partially avulsed right great toenail.  After digital block was removed without incident.  Pressure dressing applied.  Patient has been instructed to keep the dressing in place for 48 hours, then remove and keep the nailbed covered as it will be tender for the next 7-10 days.  She has been given a prescription for Norco for severe pain, but has been requested/instructed to take ibuprofen for minor discomfort MDM   Final diagnoses:  Nail avulsion, toe, initial encounter   I personally performed the services described in this documentation, which was scribed in my presence. The recorded information has been reviewed and is accurate.   Junius Creamer, NP 03/06/15 2152  Orpah Greek, MD 03/06/15 2157

## 2015-08-22 ENCOUNTER — Ambulatory Visit (HOSPITAL_COMMUNITY)
Admission: EM | Admit: 2015-08-22 | Discharge: 2015-08-22 | Disposition: A | Discharge status: Home / Self Care | Payer: PRIVATE HEALTH INSURANCE | Attending: Family Medicine | Admitting: Family Medicine

## 2015-08-22 ENCOUNTER — Encounter (HOSPITAL_COMMUNITY): Payer: Self-pay | Admitting: *Deleted

## 2015-08-22 DIAGNOSIS — I10 Essential (primary) hypertension: Secondary | ICD-10-CM | POA: Diagnosis not present

## 2015-08-22 DIAGNOSIS — Z79899 Other long term (current) drug therapy: Secondary | ICD-10-CM | POA: Insufficient documentation

## 2015-08-22 DIAGNOSIS — J302 Other seasonal allergic rhinitis: Secondary | ICD-10-CM

## 2015-08-22 DIAGNOSIS — E7800 Pure hypercholesterolemia, unspecified: Secondary | ICD-10-CM | POA: Diagnosis not present

## 2015-08-22 DIAGNOSIS — J029 Acute pharyngitis, unspecified: Secondary | ICD-10-CM | POA: Insufficient documentation

## 2015-08-22 LAB — POCT RAPID STREP A: Streptococcus, Group A Screen (Direct): NEGATIVE

## 2015-08-22 MED ORDER — FEXOFENADINE HCL 180 MG PO TABS: 180.0000 mg | ORAL_TABLET | Freq: Every day | ORAL | Status: DC

## 2015-08-22 MED ORDER — FLUTICASONE PROPIONATE 50 MCG/ACT NA SUSP: 1.0000 | Freq: Two times a day (BID) | NASAL | Status: DC

## 2015-08-22 NOTE — ED Notes (Signed)
Pt  Reports     sorethroat     With    Pain  When  She  Swallows         Pt  Reports  The  Symptoms  X  Two   Days

## 2015-08-22 NOTE — ED Provider Notes (Signed)
CSN: [id number]     Arrival date & time 08/22/15  1259 History   First MD Initiated Contact with Patient 08/22/15 1329     Chief Complaint  Patient presents with  . Sore Throat   (Consider location/radiation/quality/duration/timing/severity/associated sxs/prior Treatment) Patient is a 70 y.o. female presenting with pharyngitis. The history is provided by the patient.  Sore Throat This is a new problem. The current episode started 2 days ago. The problem has not changed since onset.Pertinent negatives include no chest pain, no abdominal pain, no headaches and no shortness of breath. The symptoms are aggravated by swallowing.    Past Medical History  Diagnosis Date  . Hypertension   . Vertigo   . High cholesterol    History reviewed. No pertinent past surgical history. History reviewed. No pertinent family history. Social History  Substance Use Topics  . Smoking status: Never Smoker   . Smokeless tobacco: None  . Alcohol Use: No   OB History    No data available     Review of Systems  Constitutional: Negative.   HENT: Positive for congestion, postnasal drip, rhinorrhea, sneezing and sore throat.   Respiratory: Negative for shortness of breath.   Cardiovascular: Negative for chest pain.  Gastrointestinal: Negative for abdominal pain.  Neurological: Negative for headaches.  All other systems reviewed and are negative.   Allergies  Codeine; Vytorin; and Lipitor  Home Medications   Prior to Admission medications   Medication Sig Start Date End Date Taking? Authorizing Provider  amLODipine (NORVASC) 5 MG tablet Take 2 tablets (10 mg total) by mouth daily. Patient taking differently: Take 5 mg by mouth daily.  06/01/14   Milton Ferguson, MD  cephALEXin (KEFLEX) 500 MG capsule Take 1 capsule (500 mg total) by mouth 4 (four) times daily. Patient not taking: Reported on 08/03/2014 06/01/14   Milton Ferguson, MD  ciprofloxacin (CILOXAN) 0.3 % ophthalmic solution Place 1 drop into the  left eye every 4 (four) hours while awake. Administer 1 drop, every 2 hours, while awake, for 2 days. Then 1 drop, every 4 hours, while awake, for the next 5 days. Patient not taking: Reported on 10/05/2014 08/03/14   Jola Schmidt, MD  diazepam (VALIUM) 5 MG tablet Take 1 tablet (5 mg total) by mouth every 8 (eight) hours as needed for anxiety (spasms). 06/01/14   Milton Ferguson, MD  fluconazole (DIFLUCAN) 150 MG tablet 1 tab po x 1. May repeat in 72 hours if no improvement Patient not taking: Reported on 08/03/2014 06/10/14   Janne Napoleon, NP  HYDROcodone-acetaminophen (NORCO/VICODIN) 5-325 MG tablet Take 1 tablet by mouth every 4 (four) hours as needed. 03/06/15   Junius Creamer, NP  hydroxypropyl methylcellulose (ISOPTO TEARS) 2.5 % ophthalmic solution Place 2 drops into both eyes daily as needed for dry eyes (dry eyes).     Historical Provider, MD  ibuprofen (ADVIL,MOTRIN) 600 MG tablet Take 1 tablet (600 mg total) by mouth every 8 (eight) hours as needed. 10/05/14   Jola Schmidt, MD  meclizine (ANTIVERT) 50 MG tablet Take 0.5 tablets (25 mg total) by mouth 3 (three) times daily as needed. Patient taking differently: Take 25 mg by mouth 3 (three) times daily as needed for dizziness or nausea.  02/08/14   Charlesetta Shanks, MD  metroNIDAZOLE (FLAGYL) 500 MG tablet Take 1 tablet (500 mg total) by mouth 2 (two) times daily. X 7 days Patient not taking: Reported on 08/03/2014 06/10/14   Janne Napoleon, NP  simvastatin (ZOCOR) 40 MG tablet Take  0.5 tablets (20 mg total) by mouth daily. Patient taking differently: Take 40 mg by mouth daily at 6 PM.  06/01/14   Milton Ferguson, MD   Meds Ordered and Administered this Visit  Medications - No data to display  BP 133/83 mmHg  Pulse 89  Temp(Src) 98.5 F (36.9 C) (Oral)  Resp 16  SpO2 98% No data found.   Physical Exam  Constitutional: She is oriented to person, place, and time. She appears well-developed and well-nourished. No distress.  HENT:  Right Ear: External  ear normal.  Left Ear: External ear normal.  Mouth/Throat: Oropharynx is clear and moist.  Eyes: Conjunctivae are normal. Pupils are equal, round, and reactive to light.  Neck: Normal range of motion. Neck supple.  Lymphadenopathy:    She has no cervical adenopathy.  Neurological: She is alert and oriented to person, place, and time.  Skin: Skin is warm and dry.  Nursing note and vitals reviewed.   ED Course  Procedures (including critical care time)  Labs Review Labs Reviewed - No data to display Strep  Neg.  Imaging Review No results found.   Visual Acuity Review  Right Eye Distance:   Left Eye Distance:   Bilateral Distance:    Right Eye Near:   Left Eye Near:    Bilateral Near:         MDM  No diagnosis found. Meds ordered this encounter  Medications  . fexofenadine (ALLEGRA) 180 MG tablet    Sig: Take 1 tablet (180 mg total) by mouth daily.    Dispense:  30 tablet    Refill:  1  . fluticasone (FLONASE) 50 MCG/ACT nasal spray    Sig: Place 1 spray into both nostrils 2 (two) times daily.    Dispense:  1 g    Refill:  2       Billy Fischer, MD 08/22/15 1344

## 2015-08-25 LAB — CULTURE, GROUP A STREP (THRC)

## 2015-10-31 ENCOUNTER — Emergency Department (HOSPITAL_COMMUNITY)
Admission: EM | Admit: 2015-10-31 | Discharge: 2015-10-31 | Disposition: A | Discharge status: Home / Self Care | Payer: Medicaid - Out of State | Attending: Emergency Medicine | Admitting: Emergency Medicine

## 2015-10-31 ENCOUNTER — Other Ambulatory Visit: Payer: Self-pay

## 2015-10-31 ENCOUNTER — Emergency Department (HOSPITAL_COMMUNITY): Payer: Medicaid - Out of State

## 2015-10-31 ENCOUNTER — Encounter (HOSPITAL_COMMUNITY): Payer: Self-pay | Admitting: Emergency Medicine

## 2015-10-31 DIAGNOSIS — R519 Headache, unspecified: Secondary | ICD-10-CM

## 2015-10-31 DIAGNOSIS — R5383 Other fatigue: Secondary | ICD-10-CM

## 2015-10-31 DIAGNOSIS — Z79899 Other long term (current) drug therapy: Secondary | ICD-10-CM | POA: Insufficient documentation

## 2015-10-31 DIAGNOSIS — R51 Headache: Secondary | ICD-10-CM | POA: Insufficient documentation

## 2015-10-31 DIAGNOSIS — R300 Dysuria: Secondary | ICD-10-CM

## 2015-10-31 DIAGNOSIS — R42 Dizziness and giddiness: Secondary | ICD-10-CM | POA: Diagnosis present

## 2015-10-31 DIAGNOSIS — N390 Urinary tract infection, site not specified: Secondary | ICD-10-CM

## 2015-10-31 DIAGNOSIS — I10 Essential (primary) hypertension: Secondary | ICD-10-CM | POA: Insufficient documentation

## 2015-10-31 LAB — COMPREHENSIVE METABOLIC PANEL
ALBUMIN: 4.7 g/dL (ref 3.5–5.0)
ALK PHOS: 79 U/L (ref 38–126)
ALT: 21 U/L (ref 14–54)
AST: 23 U/L (ref 15–41)
Anion gap: 6 (ref 5–15)
BUN: 17 mg/dL (ref 6–20)
CHLORIDE: 105 mmol/L (ref 101–111)
CO2: 29 mmol/L (ref 22–32)
Calcium: 9.3 mg/dL (ref 8.9–10.3)
Creatinine, Ser: 0.71 mg/dL (ref 0.44–1.00)
GFR calc non Af Amer: >60 mL/min (ref >60)
GLUCOSE: 111 mg/dL — AB (ref 65–99)
Potassium: 3.9 mmol/L (ref 3.5–5.1)
SODIUM: 140 mmol/L (ref 135–145)
Total Bilirubin: 1.4 mg/dL — ABNORMAL HIGH (ref 0.3–1.2)
Total Protein: 8.2 g/dL — ABNORMAL HIGH (ref 6.5–8.1)

## 2015-10-31 LAB — CBC WITH DIFFERENTIAL/PLATELET
BASOS PCT: 0 %
Basophils Absolute: 0 10*3/uL (ref 0.0–0.1)
EOS ABS: 0.1 10*3/uL (ref 0.0–0.7)
EOS PCT: 3 %
HCT: 39.3 % (ref 36.0–46.0)
HEMOGLOBIN: 13.2 g/dL (ref 12.0–15.0)
LYMPHS ABS: 1.7 10*3/uL (ref 0.7–4.0)
Lymphocytes Relative: 37 %
MCH: 30.1 pg (ref 26.0–34.0)
MCHC: 33.6 g/dL (ref 30.0–36.0)
MCV: 89.7 fL (ref 78.0–100.0)
Monocytes Absolute: 0.2 10*3/uL (ref 0.1–1.0)
Monocytes Relative: 4 %
NEUTROS PCT: 56 %
Neutro Abs: 2.6 10*3/uL (ref 1.7–7.7)
PLATELETS: 273 10*3/uL (ref 150–400)
RBC: 4.38 MIL/uL (ref 3.87–5.11)
RDW: 12.3 % (ref 11.5–15.5)
WBC: 4.6 10*3/uL (ref 4.0–10.5)

## 2015-10-31 LAB — MAGNESIUM: Magnesium: 2.1 mg/dL (ref 1.7–2.4)

## 2015-10-31 LAB — URINALYSIS, ROUTINE W REFLEX MICROSCOPIC
Bilirubin Urine: NEGATIVE
Glucose, UA: NEGATIVE mg/dL
Ketones, ur: NEGATIVE mg/dL
NITRITE: NEGATIVE
PH: 5 (ref 5.0–8.0)
Protein, ur: NEGATIVE mg/dL
SPECIFIC GRAVITY, URINE: 1.024 (ref 1.005–1.030)

## 2015-10-31 LAB — URINE MICROSCOPIC-ADD ON

## 2015-10-31 MED ORDER — CEPHALEXIN 500 MG PO CAPS: 500.0000 mg | ORAL_CAPSULE | Freq: Two times a day (BID) | ORAL | Status: DC

## 2015-10-31 MED ORDER — ONDANSETRON HCL 4 MG/2ML IJ SOLN
4.0000 mg | Freq: Once | INTRAMUSCULAR | Status: AC
  Administered 2015-10-31: 4 mg via INTRAVENOUS
  Filled 2015-10-31: qty 2

## 2015-10-31 MED ORDER — KETOROLAC TROMETHAMINE 15 MG/ML IJ SOLN
15.0000 mg | Freq: Once | INTRAMUSCULAR | Status: AC
  Administered 2015-10-31: 15 mg via INTRAVENOUS
  Filled 2015-10-31: qty 1

## 2015-10-31 MED ORDER — TRIAMCINOLONE ACETONIDE 0.1 % EX CREA: 1.0000 "application " | TOPICAL_CREAM | Freq: Two times a day (BID) | CUTANEOUS | Status: DC

## 2015-10-31 MED ORDER — SODIUM CHLORIDE 0.9 % IV BOLUS (SEPSIS)
1000.0000 mL | Freq: Once | INTRAVENOUS | Status: AC
  Administered 2015-10-31: 1000 mL via INTRAVENOUS

## 2015-10-31 NOTE — ED Notes (Signed)
MD at bedside.EVALUTED PT

## 2015-10-31 NOTE — ED Notes (Signed)
MD at bedside. 

## 2015-10-31 NOTE — ED Notes (Signed)
Patient transported to CT 

## 2015-10-31 NOTE — ED Provider Notes (Signed)
CSN: [id number]     Arrival date & time 10/31/15  1003 History   First MD Initiated Contact with Patient 10/31/15 1015     Chief Complaint  Patient presents with  . Dizziness  . Eye Problem  . Headache     (Consider location/radiation/quality/duration/timing/severity/associated sxs/prior Treatment) HPI  70 year old female who presents with lightheadedness and headache. History of hypertension, hyperlipidemia and vertigo. States that last weekend she was in the hospital taking care of her sister who has been ill. This is during this time she has not gotten much sleep, sleeping in a hospital daybed, not eating or drinking well, not been taking care of herself. Over the past several days she has had intermittent headaches, nausea, lightheadness, generalized weakness and back and neck aching. Yesterday noticed that both her eyes were tired and bloodshot, but this is better today. No fevers, chills. Has noted some dysuria, but no flank pain or abdominal pain. No chest pain, difficulty breathing, vision or speech changes, vomiting, focal numbness or weakness.  Past Medical History  Diagnosis Date  . Hypertension   . Vertigo   . High cholesterol    History reviewed. No pertinent past surgical history. History reviewed. No pertinent family history. Social History  Substance Use Topics  . Smoking status: Never Smoker   . Smokeless tobacco: None  . Alcohol Use: No   OB History    No data available     Review of Systems 10/14 systems reviewed and are negative other than those stated in the HPI   Allergies  Codeine; Vytorin; and Lipitor  Home Medications   Prior to Admission medications   Medication Sig Start Date End Date Taking? Authorizing Provider  amLODipine (NORVASC) 5 MG tablet Take 2 tablets (10 mg total) by mouth daily. Patient taking differently: Take 5 mg by mouth daily.  06/01/14  Yes Milton Ferguson, MD  diazepam (VALIUM) 5 MG tablet Take 1 tablet (5 mg total) by mouth  every 8 (eight) hours as needed for anxiety (spasms). 06/01/14  Yes Milton Ferguson, MD  fexofenadine (ALLEGRA) 180 MG tablet Take 1 tablet (180 mg total) by mouth daily. Patient taking differently: Take 180 mg by mouth daily as needed for allergies.  08/22/15  Yes Billy Fischer, MD  fluticasone (FLONASE) 50 MCG/ACT nasal spray Place 1 spray into both nostrils 2 (two) times daily. Patient taking differently: Place 1 spray into both nostrils daily as needed for allergies.  08/22/15  Yes Billy Fischer, MD  meclizine (ANTIVERT) 25 MG tablet Take 25 mg by mouth 2 (two) times daily as needed for dizziness.   Yes Historical Provider, MD  simvastatin (ZOCOR) 40 MG tablet Take 0.5 tablets (20 mg total) by mouth daily. Patient taking differently: Take 40 mg by mouth daily at 6 PM.  06/01/14  Yes Milton Ferguson, MD  cephALEXin (KEFLEX) 500 MG capsule Take 1 capsule (500 mg total) by mouth 2 (two) times daily. 10/31/15   Forde Dandy, MD  triamcinolone cream (KENALOG) 0.1 % Apply 1 application topically 2 (two) times daily. 10/31/15   Forde Dandy, MD   BP 126/84 mmHg  Pulse 84  Temp(Src) 98.6 F (37 C) (Oral)  Resp 16  SpO2 100% Physical Exam Physical Exam  Nursing note and vitals reviewed. Constitutional: Well developed, well nourished, non-toxic, and in no acute distress Head: Normocephalic and atraumatic.  Mouth/Throat: Oropharynx is clear and moist.  Neck: Normal range of motion. Neck supple.  Cardiovascular: Normal rate and  regular rhythm.   Pulmonary/Chest: Effort normal and breath sounds normal.  Abdominal: Soft. There is no tenderness. There is no rebound and no guarding.  Musculoskeletal: Normal range of motion.  Neurological: Alert, no facial droop, fluent speech, moves all extremities symmetrically, sensation to light touch in tact throughout, normal gait, no dysmetria with finger to nose, PERRL, EOMI Skin: Skin is warm and dry.  Psychiatric: Cooperative  ED Course  Procedures (including  critical care time) Labs Review Labs Reviewed  COMPREHENSIVE METABOLIC PANEL - Abnormal; Notable for the following:    Glucose, Bld 111 (*)    Total Protein 8.2 (*)    Total Bilirubin 1.4 (*)    All other components within normal limits  URINALYSIS, ROUTINE W REFLEX MICROSCOPIC (NOT AT [facility]) - Abnormal; Notable for the following:    APPearance CLOUDY (*)    Hgb urine dipstick TRACE (*)    Leukocytes, UA MODERATE (*)    All other components within normal limits  URINE MICROSCOPIC-ADD ON - Abnormal; Notable for the following:    Squamous Epithelial / LPF 0-5 (*)    Bacteria, UA MANY (*)    All other components within normal limits  URINE CULTURE  CBC WITH DIFFERENTIAL/PLATELET  MAGNESIUM    Imaging Review Ct Head Wo Contrast  10/31/2015  CLINICAL DATA:  Lightheadedness, headaches, redness in the right eye and dysuria for 1 week. No known injury. Initial encounter. EXAM: CT HEAD WITHOUT CONTRAST TECHNIQUE: Contiguous axial images were obtained from the base of the skull through the vertex without intravenous contrast. COMPARISON:  None. FINDINGS: The brain appears normal without hemorrhage, infarct, mass lesion, mass effect, midline shift or abnormal extra-axial fluid collection. No hydrocephalus or pneumocephalus. The calvarium is intact. Imaged paranasal sinuses and mastoid air cells are clear. IMPRESSION: Negative head CT. Electronically Signed   By: Inge Rise M.D.   On: 10/31/2015 11:21   I have personally reviewed and evaluated these images and lab results as part of my medical decision-making.   EKG Interpretation   Date/Time:  Wednesday October 31 2015 10:12:54 EDT Ventricular Rate:  84 PR Interval:    QRS Duration: 73 QT Interval:  491 QTC Calculation: 581 R Axis:   24 Text Interpretation:  Sinus rhythm Probable left atrial enlargement Low  voltage, precordial leads Probable anteroseptal infarct, old Prolonged QT  interval No acute changes Confirmed by LIU MD, Hinton Dyer  [phone]) on 10/31/2015  10:23:55 AM      MDM   Final diagnoses:  Dysuria  UTI (lower urinary tract infection)  Acute nonintractable headache, unspecified headache type  Other fatigue    70 year old female with history of hypertension, hyperlipidemia who presents with fatigue, lightheadedness after taking care of sister in the hospital. Seems like she was not caring for self, eating or drinking well or sleeping well during this time which is the cause of her symptoms likely. She is well-appearing and in no acute distress. Her vital signs are within normal limits. Mildly dry on exam. Dizziness is not consistent with that of her typical vertigo and seems more lightheaded and she feels that when she stands up and better when sitting down. Basic blood work reveals no major electrolyte or metabolic derangements. Ct head negative for intracranial processes in setting of persistent headache, but normal neuro exam and no further imaging felt necessary. Given migraine-like cocktail with IVF. Feels significantly improved. With UTI symptoms and UA also showing moderates leukocytes and few bacteria and WBCs. Sent for culture but since she  states that this is typical for her UTIs, she'll was given a course of Keflex. At this time I feel that she is appropriate for discharge with continued supportive management at home. Strict return and follow-up instructions are reviewed. She expressed understanding of all discharge instructions, and felt comfortable with the plan of care.    Forde Dandy, MD 10/31/15 [phone]

## 2015-10-31 NOTE — ED Notes (Addendum)
Pt reports lightheadedness, R eye redness, and dysuria for the last week. No CP or SOB. Also reports a HA that is better than yesterday.

## 2015-10-31 NOTE — Discharge Instructions (Signed)
Drink fluids, get plenty of rest. Take motrin and tylenol for pain. Take antibiotic for potential UTI. Return for worsening symptoms, including fever, vomiting and unable to keep down food/fluids, passing out, worsening pain, confusion or any other symptoms concerning to you  Urinary Tract Infection Urinary tract infections (UTIs) can develop anywhere along your urinary tract. Your urinary tract is your body's drainage system for removing wastes and extra water. Your urinary tract includes two kidneys, two ureters, a bladder, and a urethra. Your kidneys are a pair of bean-shaped organs. Each kidney is about the size of your fist. They are located below your ribs, one on each side of your spine. CAUSES Infections are caused by microbes, which are microscopic organisms, including fungi, viruses, and bacteria. These organisms are so small that they can only be seen through a microscope. Bacteria are the microbes that most commonly cause UTIs. SYMPTOMS  Symptoms of UTIs may vary by age and gender of the patient and by the location of the infection. Symptoms in young women typically include a frequent and intense urge to urinate and a painful, burning feeling in the bladder or urethra during urination. Older women and men are more likely to be tired, shaky, and weak and have muscle aches and abdominal pain. A fever may mean the infection is in your kidneys. Other symptoms of a kidney infection include pain in your back or sides below the ribs, nausea, and vomiting. DIAGNOSIS To diagnose a UTI, your caregiver will ask you about your symptoms. Your caregiver will also ask you to provide a urine sample. The urine sample will be tested for bacteria and white blood cells. White blood cells are made by your body to help fight infection. TREATMENT  Typically, UTIs can be treated with medication. Because most UTIs are caused by a bacterial infection, they usually can be treated with the use of antibiotics. The choice  of antibiotic and length of treatment depend on your symptoms and the type of bacteria causing your infection. HOME CARE INSTRUCTIONS  If you were prescribed antibiotics, take them exactly as your caregiver instructs you. Finish the medication even if you feel better after you have only taken some of the medication.  Drink enough water and fluids to keep your urine clear or pale yellow.  Avoid caffeine, tea, and carbonated beverages. They tend to irritate your bladder.  Empty your bladder often. Avoid holding urine for long periods of time.  Empty your bladder before and after sexual intercourse.  After a bowel movement, women should cleanse from front to back. Use each tissue only once. SEEK MEDICAL CARE IF:   You have back pain.  You develop a fever.  Your symptoms do not begin to resolve within 3 days. SEEK IMMEDIATE MEDICAL CARE IF:   You have severe back pain or lower abdominal pain.  You develop chills.  You have nausea or vomiting.  You have continued burning or discomfort with urination. MAKE SURE YOU:   Understand these instructions.  Will watch your condition.  Will get help right away if you are not doing well or get worse.   This information is not intended to replace advice given to you by your health care provider. Make sure you discuss any questions you have with your health care provider.   Document Released: 02/05/2005 Document Revised: 01/17/2015 Document Reviewed: 06/06/2011 Elsevier Interactive Patient Education [employer].

## 2015-11-01 LAB — URINE CULTURE: Culture: 50000 — AB

## 2015-11-02 ENCOUNTER — Telehealth: Payer: Self-pay | Admitting: *Deleted

## 2015-11-02 NOTE — ED Notes (Signed)
Post ED Visit - Positive Culture Follow-up  Culture report reviewed by antimicrobial stewardship pharmacist:  []  Elenor Quinones, Pharm.D. []  Heide Guile, Pharm.D., BCPS []  Parks Neptune, Pharm.D. []  Alycia Rossetti, Pharm.D., BCPS []  [place], Pharm.D., BCPS, AAHIVP []  Legrand Como, Pharm.D., BCPS, AAHIVP [x]  Milus Glazier, Pharm.D. []  Rob Evette Doffing, Pharm.D.  Positive urine culture Treated with Cephalexin, organism sensitive to the same and no further patient follow-up is required at this time.  Harlon Flor [facility] 11/02/2015, 11:19 AM

## 2015-11-09 ENCOUNTER — Ambulatory Visit (HOSPITAL_COMMUNITY)
Admission: EM | Admit: 2015-11-09 | Discharge: 2015-11-09 | Discharge status: Absent without leave | Payer: PRIVATE HEALTH INSURANCE | Attending: Emergency Medicine | Admitting: Emergency Medicine

## 2016-05-22 ENCOUNTER — Emergency Department (HOSPITAL_COMMUNITY)
Admission: EM | Admit: 2016-05-22 | Discharge: 2016-05-22 | Disposition: A | Discharge status: Home / Self Care | Payer: PRIVATE HEALTH INSURANCE | Attending: Emergency Medicine | Admitting: Emergency Medicine

## 2016-05-22 ENCOUNTER — Encounter (HOSPITAL_COMMUNITY): Payer: Self-pay

## 2016-05-22 ENCOUNTER — Emergency Department (HOSPITAL_COMMUNITY): Payer: PRIVATE HEALTH INSURANCE

## 2016-05-22 DIAGNOSIS — B9789 Other viral agents as the cause of diseases classified elsewhere: Secondary | ICD-10-CM

## 2016-05-22 DIAGNOSIS — J069 Acute upper respiratory infection, unspecified: Secondary | ICD-10-CM | POA: Diagnosis not present

## 2016-05-22 DIAGNOSIS — R05 Cough: Secondary | ICD-10-CM | POA: Insufficient documentation

## 2016-05-22 DIAGNOSIS — I10 Essential (primary) hypertension: Secondary | ICD-10-CM | POA: Diagnosis not present

## 2016-05-22 DIAGNOSIS — Z79899 Other long term (current) drug therapy: Secondary | ICD-10-CM | POA: Diagnosis not present

## 2016-05-22 MED ORDER — BENZONATATE 100 MG PO CAPS: 100.0000 mg | ORAL_CAPSULE | Freq: Three times a day (TID) | ORAL | 0 refills | Status: DC

## 2016-05-22 NOTE — ED Triage Notes (Signed)
Patient c/o congested productive cough with white sputum x 1 1/2 weeks that is worse at nights.

## 2016-05-22 NOTE — Discharge Instructions (Signed)
Return if any problems.

## 2016-05-22 NOTE — ED Provider Notes (Signed)
[facility] DEPT Provider Note   CSN: [id number] Arrival date & time: 05/22/16  1659  By signing my name below, I, Ephriam Jenkins, attest that this documentation has been prepared under the direction and in the presence of Alyse Low PA-C.  Electronically Signed: Ephriam Jenkins, ED Scribe. 05/22/16. 6:15 PM.   History   Chief Complaint Chief Complaint  Patient presents with  . Cough    HPI HPI Comments: Susan Berg is a 71 y.o. female, with no significant Hx, who presents to the Emergency Department complaining of persistent productive cough with white sputum that started approximately two weeks ago. Pt also reports associated intermittent wheezing that has become worse, predominately at night. Her cough is also worse at night. Pt is from [place] and does not have a PCP here. She is only being treated for HTN, HLD, and Vertigo. No shortness of breath. No other daily medications. No fever.  The history is provided by the patient. No language interpreter was used.    Past Medical History:  Diagnosis Date  . High cholesterol   . Hypertension   . Vertigo     There are no active problems to display for this patient.   Past Surgical History:  Procedure Laterality Date  . ANKLE SURGERY      OB History    No data available       Home Medications    Prior to Admission medications   Medication Sig Start Date End Date Taking? Authorizing Provider  amLODipine (NORVASC) 5 MG tablet Take 2 tablets (10 mg total) by mouth daily. Patient taking differently: Take 5 mg by mouth daily.  06/01/14   Milton Ferguson, MD  cephALEXin (KEFLEX) 500 MG capsule Take 1 capsule (500 mg total) by mouth 2 (two) times daily. 10/31/15   Forde Dandy, MD  diazepam (VALIUM) 5 MG tablet Take 1 tablet (5 mg total) by mouth every 8 (eight) hours as needed for anxiety (spasms). 06/01/14   Milton Ferguson, MD  fexofenadine (ALLEGRA) 180 MG tablet Take 1 tablet (180 mg total) by mouth daily. Patient  taking differently: Take 180 mg by mouth daily as needed for allergies.  08/22/15   Billy Fischer, MD  fluticasone (FLONASE) 50 MCG/ACT nasal spray Place 1 spray into both nostrils 2 (two) times daily. Patient taking differently: Place 1 spray into both nostrils daily as needed for allergies.  08/22/15   Billy Fischer, MD  meclizine (ANTIVERT) 25 MG tablet Take 25 mg by mouth 2 (two) times daily as needed for dizziness.    Historical Provider, MD  simvastatin (ZOCOR) 40 MG tablet Take 0.5 tablets (20 mg total) by mouth daily. Patient taking differently: Take 40 mg by mouth daily at 6 PM.  06/01/14   Milton Ferguson, MD  triamcinolone cream (KENALOG) 0.1 % Apply 1 application topically 2 (two) times daily. 10/31/15   Forde Dandy, MD    Family History Family History  Problem Relation Age of Onset  . Diabetes Mother   . Rheum arthritis Mother   . Stroke Mother   . Cancer Father    Social History Social History  Substance Use Topics  . Smoking status: Never Smoker  . Smokeless tobacco: Never Used  . Alcohol use No    Allergies   Codeine; Vytorin [ezetimibe-simvastatin]; and Lipitor [atorvastatin]   Review of Systems Review of Systems  Constitutional: Negative for fever.  Respiratory: Positive for cough and wheezing. Negative for chest tightness and shortness of  breath.     Physical Exam Updated Vital Signs BP 135/95   Pulse 94   Temp 98 F (36.7 C) (Oral)   Resp 20   Ht 5\' 5"  (1.651 m)   Wt 172 lb (78 kg)   SpO2 97%   BMI 28.62 kg/m   Physical Exam  Constitutional: She is oriented to person, place, and time. She appears well-developed and well-nourished. No distress.  HENT:  Head: Normocephalic and atraumatic.  Neck: Normal range of motion.  Cardiovascular: Normal rate and regular rhythm.   Pulmonary/Chest: Effort normal.  Neurological: She is alert and oriented to person, place, and time.  Skin: Skin is warm and dry. She is not diaphoretic.  Psychiatric: She has a  normal mood and affect. Judgment normal.  Nursing note and vitals reviewed.   ED Treatments / Results  DIAGNOSTIC STUDIES: Oxygen Saturation is 97% on RA, normal by my interpretation.  COORDINATION OF CARE: 6:13 PM-Discussed treatment plan with pt at bedside and pt agreed to plan.   Labs (all labs ordered are listed, but only abnormal results are displayed) Labs Reviewed - No data to display  EKG  EKG Interpretation None       Radiology No results found.  Procedures Procedures (including critical care time)  Medications Ordered in ED Medications - No data to display   Initial Impression / Assessment and Plan / ED Course  I have reviewed the triage vital signs and the nursing notes.  Pertinent labs & imaging results that were available during my care of the patient were reviewed by me and considered in my medical decision making (see chart for details).  Clinical Course    I counseled pt.  I suspect viral illness.  I advised return if symtpms worse.  Pt given tessalon perles for cough   Final Clinical Impressions(s) / ED Diagnoses   Final diagnoses:  Viral URI with cough    New Prescriptions Discharge Medication List as of 05/22/2016  7:04 PM    START taking these medications   Details  benzonatate (TESSALON) 100 MG capsule Take 1 capsule (100 mg total) by mouth every 8 (eight) hours., Starting Thu 05/22/2016, Print      An After Visit Summary was printed and given to the patient.  I personally performed the services in this documentation, which was scribed in my presence.  The recorded information has been reviewed and considered.   Ronnald Collum.   Hollace Kinnier Montgomery, PA-C 05/22/16 [place], MD 05/23/16 [phone]

## 2016-05-22 NOTE — ED Notes (Signed)
Pt ambulatory and independent at discharge.  Verbalized understanding of discharge instructions 

## 2019-04-15 ENCOUNTER — Encounter (HOSPITAL_COMMUNITY): Payer: Self-pay

## 2019-04-15 ENCOUNTER — Other Ambulatory Visit: Payer: Self-pay

## 2019-04-15 ENCOUNTER — Ambulatory Visit (HOSPITAL_COMMUNITY)
Admission: EM | Admit: 2019-04-15 | Discharge: 2019-04-15 | Disposition: A | Discharge status: Home / Self Care | Payer: Medicare (Managed Care)

## 2019-04-15 DIAGNOSIS — R42 Dizziness and giddiness: Secondary | ICD-10-CM

## 2019-04-15 DIAGNOSIS — E782 Mixed hyperlipidemia: Secondary | ICD-10-CM

## 2019-04-15 DIAGNOSIS — I10 Essential (primary) hypertension: Secondary | ICD-10-CM

## 2019-04-15 MED ORDER — MECLIZINE HCL 25 MG PO TABS: 25.0000 mg | ORAL_TABLET | Freq: Two times a day (BID) | ORAL | 1 refills | Status: DC | PRN

## 2019-04-15 NOTE — ED Triage Notes (Signed)
Pt. States she has been out of her Vertigo meds for a month now, needs a refill.

## 2019-04-15 NOTE — ED Provider Notes (Addendum)
[place]   MRN: [id number] DOB: 1946/01/22  Subjective:   Susan Berg is a 73 y.o. female presenting for medication refill.  Patient has long standing history of vertigo and does very well with Antivert.  She is originally from [place] but due to Covid came down to live in [place] in the spring.  She has not established care here.  She has tried to social distance and avoid being exposed to COVID-19.  States that she does very well with her medication but ran out.  Denies having chest pain, heart racing, palpitations, cough, shortness of breath, fever, confusion, headaches.  She is very compliant with her blood pressure medications and her cholesterol medications.  No current facility-administered medications for this encounter.   Current Outpatient Medications:  .  losartan (COZAAR) 25 MG tablet, Take 25 mg by mouth daily., Disp: , Rfl:  .  amLODipine (NORVASC) 5 MG tablet, Take 2 tablets (10 mg total) by mouth daily. (Patient taking differently: Take 5 mg by mouth daily. ), Disp: 30 tablet, Rfl: 0 .  fluticasone (FLONASE) 50 MCG/ACT nasal spray, Place 1 spray into both nostrils 2 (two) times daily. (Patient taking differently: Place 1 spray into both nostrils daily as needed for allergies. ), Disp: 1 g, Rfl: 2 .  meclizine (ANTIVERT) 25 MG tablet, Take 25 mg by mouth 2 (two) times daily as needed for dizziness., Disp: , Rfl:  .  simvastatin (ZOCOR) 40 MG tablet, Take 0.5 tablets (20 mg total) by mouth daily. (Patient taking differently: Take 40 mg by mouth daily at 6 PM. ), Disp: 30 tablet, Rfl: 0 .  triamcinolone cream (KENALOG) 0.1 %, Apply 1 application topically 2 (two) times daily., Disp: 30 g, Rfl: 0    Allergies  Allergen Reactions  . Codeine Nausea And Vomiting  . Vytorin [Ezetimibe-Simvastatin] Hives  . Lipitor [Atorvastatin] Rash    Past Medical History:  Diagnosis Date  . High cholesterol   . Hypertension   . Vertigo      Past Surgical History:   Procedure Laterality Date  . ANKLE SURGERY      Family History  Problem Relation Age of Onset  . Diabetes Mother   . Rheum arthritis Mother   . Stroke Mother   . Cancer Father     Social History   Tobacco Use  . Smoking status: Never Smoker  . Smokeless tobacco: Never Used  Substance Use Topics  . Alcohol use: No  . Drug use: No    ROS   Objective:   Vitals: BP (!) 143/86 (BP Location: Left Arm)   Pulse 93   Temp 98.4 F (36.9 C) (Oral)   Resp 17   Wt 174 lb (78.9 kg)   SpO2 97%   BMI 28.96 kg/m   Physical Exam Constitutional:      General: She is not in acute distress.    Appearance: Normal appearance. She is well-developed. She is not ill-appearing, toxic-appearing or diaphoretic.  HENT:     Head: Normocephalic and atraumatic.     Nose: Nose normal.     Mouth/Throat:     Mouth: Mucous membranes are moist.  Eyes:     Extraocular Movements: Extraocular movements intact.     Pupils: Pupils are equal, round, and reactive to light.  Cardiovascular:     Rate and Rhythm: Normal rate and regular rhythm.     Pulses: Normal pulses.     Heart sounds: Normal heart sounds. No murmur. No  friction rub. No gallop.   Pulmonary:     Effort: Pulmonary effort is normal. No respiratory distress.     Breath sounds: Normal breath sounds. No stridor. No wheezing, rhonchi or rales.  Skin:    General: Skin is warm and dry.     Findings: No rash.  Neurological:     Mental Status: She is alert and oriented to person, place, and time.     Cranial Nerves: No cranial nerve deficit.     Motor: No weakness.     Coordination: Coordination normal.     Gait: Gait normal.     Deep Tendon Reflexes: Reflexes normal.  Psychiatric:        Mood and Affect: Mood normal.        Behavior: Behavior normal.        Thought Content: Thought content normal.        Judgment: Judgment normal.      Assessment and Plan :   1. Vertigo   2. Essential hypertension   3. Mixed hyperlipidemia      Physical exam findings and vital signs stable for discharge.  I refilled meclizine for patient. Counseled patient on potential for adverse effects with medications prescribed/recommended today, ER and return-to-clinic precautions discussed, patient verbalized understanding.  She is to contact [facility] to obtain PCP.    Jaynee Eagles, [place] 04/15/19 1909

## 2019-08-12 ENCOUNTER — Other Ambulatory Visit: Payer: Self-pay

## 2019-08-12 ENCOUNTER — Ambulatory Visit (HOSPITAL_COMMUNITY)
Admission: EM | Admit: 2019-08-12 | Discharge: 2019-08-12 | Disposition: A | Discharge status: Home / Self Care | Payer: Medicare (Managed Care) | Attending: Family Medicine | Admitting: Family Medicine

## 2019-08-12 ENCOUNTER — Encounter (HOSPITAL_COMMUNITY): Payer: Self-pay

## 2019-08-12 DIAGNOSIS — E785 Hyperlipidemia, unspecified: Secondary | ICD-10-CM

## 2019-08-12 DIAGNOSIS — H8110 Benign paroxysmal vertigo, unspecified ear: Secondary | ICD-10-CM | POA: Diagnosis not present

## 2019-08-12 DIAGNOSIS — I10 Essential (primary) hypertension: Secondary | ICD-10-CM

## 2019-08-12 MED ORDER — SIMVASTATIN 40 MG PO TABS: 40.0000 mg | ORAL_TABLET | Freq: Every day | ORAL | 0 refills | Status: DC

## 2019-08-12 MED ORDER — MECLIZINE HCL 25 MG PO TABS: 25.0000 mg | ORAL_TABLET | Freq: Two times a day (BID) | ORAL | 1 refills | Status: DC | PRN

## 2019-08-12 MED ORDER — LOSARTAN POTASSIUM 25 MG PO TABS: 25.0000 mg | ORAL_TABLET | Freq: Every day | ORAL | 0 refills | Status: DC

## 2019-08-12 NOTE — ED Provider Notes (Signed)
[place]    CSN: [id number] Arrival date & time: 08/12/19  1507      History   Chief Complaint Chief Complaint  Patient presents with  . Medication Refill    HPI Susan Berg is a 74 y.o. female.   HPI  Patient states that she lives in [place] She states that she has been down here visiting family She needs medication refills. She has been stable on his medicines for some time. She has a primary care doctor that she sees regularly.  She states she gets blood work twice a year. She has no complaints  Past Medical History:  Diagnosis Date  . High cholesterol   . Hypertension   . Vertigo     There are no problems to display for this patient.   Past Surgical History:  Procedure Laterality Date  . ANKLE SURGERY      OB History   No obstetric history on file.      Home Medications    Prior to Admission medications   Medication Sig Start Date End Date Taking? Authorizing Provider  amLODipine (NORVASC) 5 MG tablet Take 2 tablets (10 mg total) by mouth daily. Patient taking differently: Take 5 mg by mouth daily.  06/01/14   Milton Ferguson, MD  fluticasone (FLONASE) 50 MCG/ACT nasal spray Place 1 spray into both nostrils 2 (two) times daily. Patient taking differently: Place 1 spray into both nostrils daily as needed for allergies.  08/22/15   Billy Fischer, MD  losartan (COZAAR) 25 MG tablet Take 1 tablet (25 mg total) by mouth daily. 08/12/19   Raylene Everts, MD  meclizine (ANTIVERT) 25 MG tablet Take 1 tablet (25 mg total) by mouth 2 (two) times daily as needed for dizziness. 08/12/19   Raylene Everts, MD  simvastatin (ZOCOR) 40 MG tablet Take 1 tablet (40 mg total) by mouth daily. 08/12/19   Raylene Everts, MD  triamcinolone cream (KENALOG) 0.1 % Apply 1 application topically 2 (two) times daily. 10/31/15   Forde Dandy, MD  fexofenadine (ALLEGRA) 180 MG tablet Take 1 tablet (180 mg total) by mouth daily. Patient taking  differently: Take 180 mg by mouth daily as needed for allergies.  08/22/15 04/15/19  Billy Fischer, MD    Family History Family History  Problem Relation Age of Onset  . Diabetes Mother   . Rheum arthritis Mother   . Stroke Mother   . Cancer Father     Social History Social History   Tobacco Use  . Smoking status: Never Smoker  . Smokeless tobacco: Never Used  Substance Use Topics  . Alcohol use: No  . Drug use: No     Allergies   Codeine, Vytorin [ezetimibe-simvastatin], and Lipitor [atorvastatin]   Review of Systems Review of Systems  Constitutional:       No complaints     Physical Exam Triage Vital Signs ED Triage Vitals  Enc Vitals Group     BP 08/12/19 1527 (!) 153/80     Pulse Rate 08/12/19 1527 (!) 103     Resp 08/12/19 1527 16     Temp 08/12/19 1527 97.8 F (36.6 C)     Temp Source 08/12/19 1527 Oral     SpO2 08/12/19 1527 100 %     Weight --      Height --      Head Circumference --      Peak Flow --  Pain Score 08/12/19 1525 0     Pain Loc --      Pain Edu? --      Excl. in [place]? --    No data found.  Updated Vital Signs BP (!) 153/80 (BP Location: Right Arm)   Pulse (!) 103   Temp 97.8 F (36.6 C) (Oral)   Resp 16   SpO2 100%      Physical Exam Constitutional:      General: She is not in acute distress.    Appearance: She is well-developed.  HENT:     Head: Normocephalic and atraumatic.     Mouth/Throat:     Comments: Mask in place Eyes:     Conjunctiva/sclera: Conjunctivae normal.     Pupils: Pupils are equal, round, and reactive to light.  Cardiovascular:     Rate and Rhythm: Normal rate and regular rhythm.     Heart sounds: Murmur present.     Comments: Soft systolic murmur at right upper sternal border Pulmonary:     Effort: Pulmonary effort is normal. No respiratory distress.     Breath sounds: Normal breath sounds.  Musculoskeletal:        General: Normal range of motion.     Cervical back: Normal range of  motion.  Skin:    General: Skin is warm and dry.  Neurological:     Mental Status: She is alert.  Psychiatric:        Mood and Affect: Mood normal.        Behavior: Behavior normal.      UC Treatments / Results  Labs (all labs ordered are listed, but only abnormal results are displayed) Labs Reviewed - No data to display  EKG   Radiology No results found.  Procedures Procedures (including critical care time)  Medications Ordered in UC Medications - No data to display  Initial Impression / Assessment and Plan / UC Course  I have reviewed the triage vital signs and the nursing notes.  Pertinent labs & imaging results that were available during my care of the patient were reviewed by me and considered in my medical decision making (see chart for details).      Final Clinical Impressions(s) / UC Diagnoses   Final diagnoses:  Hyperlipidemia, unspecified hyperlipidemia type  Essential hypertension  Benign paroxysmal positional vertigo, unspecified laterality     Discharge Instructions     Medicines refilled Return as needed   ED Prescriptions    Medication Sig Dispense Auth. Provider   losartan (COZAAR) 25 MG tablet Take 1 tablet (25 mg total) by mouth daily. 90 tablet Raylene Everts, MD   simvastatin (ZOCOR) 40 MG tablet  (Status: Discontinued) Take 1 tablet (40 mg total) by mouth daily. 90 tablet Raylene Everts, MD   meclizine (ANTIVERT) 25 MG tablet Take 1 tablet (25 mg total) by mouth 2 (two) times daily as needed for dizziness. 60 tablet Raylene Everts, MD   simvastatin (ZOCOR) 40 MG tablet Take 1 tablet (40 mg total) by mouth daily. 90 tablet Raylene Everts, MD     PDMP not reviewed this encounter.   Raylene Everts, MD 08/12/19 1556

## 2019-08-12 NOTE — ED Triage Notes (Signed)
Pt in need of refill of losartan, simvistatin, and meclezine.

## 2019-08-12 NOTE — Discharge Instructions (Addendum)
Medicines refilled Return as needed

## 2019-09-06 ENCOUNTER — Ambulatory Visit (INDEPENDENT_AMBULATORY_CARE_PROVIDER_SITE_OTHER): Payer: Medicare (Managed Care) | Admitting: Registered Nurse

## 2019-09-06 ENCOUNTER — Other Ambulatory Visit: Payer: Self-pay

## 2019-09-06 ENCOUNTER — Encounter: Payer: Self-pay | Admitting: Registered Nurse

## 2019-09-06 VITALS — BP 145/81 | HR 89 | Temp 97.8°F | Resp 16 | Ht 65.0 in | Wt 179.4 lb

## 2019-09-06 DIAGNOSIS — Z8639 Personal history of other endocrine, nutritional and metabolic disease: Secondary | ICD-10-CM | POA: Diagnosis not present

## 2019-09-06 DIAGNOSIS — R21 Rash and other nonspecific skin eruption: Secondary | ICD-10-CM

## 2019-09-06 DIAGNOSIS — Z7689 Persons encountering health services in other specified circumstances: Secondary | ICD-10-CM | POA: Diagnosis not present

## 2019-09-06 DIAGNOSIS — I10 Essential (primary) hypertension: Secondary | ICD-10-CM | POA: Diagnosis not present

## 2019-09-06 DIAGNOSIS — Z1329 Encounter for screening for other suspected endocrine disorder: Secondary | ICD-10-CM

## 2019-09-06 DIAGNOSIS — H9313 Tinnitus, bilateral: Secondary | ICD-10-CM

## 2019-09-06 DIAGNOSIS — Z1159 Encounter for screening for other viral diseases: Secondary | ICD-10-CM

## 2019-09-06 DIAGNOSIS — Z1211 Encounter for screening for malignant neoplasm of colon: Secondary | ICD-10-CM

## 2019-09-06 DIAGNOSIS — Z1231 Encounter for screening mammogram for malignant neoplasm of breast: Secondary | ICD-10-CM

## 2019-09-06 MED ORDER — TRIAMCINOLONE ACETONIDE 0.1 % EX CREA: 1.0000 "application " | TOPICAL_CREAM | Freq: Two times a day (BID) | CUTANEOUS | 0 refills | Status: DC

## 2019-09-06 NOTE — Addendum Note (Signed)
Addended by: Maximiano Coss on: 09/06/2019 02:00 PM   Modules accepted: Orders

## 2019-09-06 NOTE — Progress Notes (Signed)
New Patient Office Visit  Subjective:  Patient ID: Susan Berg, female    DOB: June 20, 1945  Age: 74 y.o. MRN: [id number]  CC:  Chief Complaint  Patient presents with  . New Patient (Initial Visit)    Establish care patient states she has been having ringing in both of her ears for a while and seems to be getting worse   . Medication Refill    KENALOG cream    HPI Susan Berg presents for visit to establish care.  Concerned for Tinnitus. Has been ongoing for years but worsening over the preceding months. She believes it started when she first got vertigo, however, the symptoms do not seem to be related. Her L ear is affected moreso than her right. Denies hearing loss and ear pain. No hx of ruptured tympanic membrane. No recent URI, AOM, OE.   Also notes that she is due for blood work and [employer]. Both will be ordered today.  She lives in [place] but has been down in [place] since the pandemic began. She is originally from this area and her son lives down here. Unsure of her plans to go back to [place].  Past Medical History:  Diagnosis Date  . High cholesterol   . Hypertension   . Vertigo     Past Surgical History:  Procedure Laterality Date  . ANKLE SURGERY      Family History  Problem Relation Age of Onset  . Diabetes Mother   . Rheum arthritis Mother   . Stroke Mother   . Cancer Father     Social History   Socioeconomic History  . Marital status: Widowed    Spouse name: Not on file  . Number of children: 2  . Years of education: Not on file  . Highest education level: Not on file  Occupational History  . Not on file  Tobacco Use  . Smoking status: Never Smoker  . Smokeless tobacco: Never Used  Substance and Sexual Activity  . Alcohol use: No  . Drug use: No  . Sexual activity: Not on file  Other Topics Concern  . Not on file  Social History Narrative  . Not on file   Social Determinants of Health   Financial Resource Strain:   . Difficulty of Paying  Living Expenses:   Food Insecurity:   . Worried About Charity fundraiser in the Last Year:   . Arboriculturist in the Last Year:   Transportation Needs:   . Film/video editor (Medical):   Marland Kitchen Lack of Transportation (Non-Medical):   Physical Activity:   . Days of Exercise per Week:   . Minutes of Exercise per Session:   Stress:   . Feeling of Stress :   Social Connections:   . Frequency of Communication with Friends and Family:   . Frequency of Social Gatherings with Friends and Family:   . Attends Religious Services:   . Active Member of Clubs or Organizations:   . Attends Archivist Meetings:   Marland Kitchen Marital Status:   Intimate Partner Violence:   . Fear of Current or Ex-Partner:   . Emotionally Abused:   Marland Kitchen Physically Abused:   . Sexually Abused:     ROS Review of Systems  Constitutional: Negative.   HENT: Positive for tinnitus. Negative for congestion, dental problem, drooling, ear discharge, ear pain, facial swelling, hearing loss, mouth sores, nosebleeds, postnasal drip, rhinorrhea, sinus pressure, sinus pain, sneezing, sore throat, trouble swallowing  and voice change.   Eyes: Negative.   Respiratory: Negative.   Cardiovascular: Negative.   Gastrointestinal: Negative.   Endocrine: Negative.   Genitourinary: Negative.   Musculoskeletal: Negative.   Skin: Negative.   Allergic/Immunologic: Negative.   Neurological: Negative.   Hematological: Negative.   Psychiatric/Behavioral: Negative.   All other systems reviewed and are negative.   Objective:   Today's Vitals: BP (!) 145/81   Pulse 89   Temp 97.8 F (36.6 C) (Temporal)   Resp 16   Ht 5\' 5"  (1.651 m)   Wt 179 lb 6.4 oz (81.4 kg)   SpO2 96%   BMI 29.85 kg/m   Physical Exam Vitals and nursing note reviewed.  Constitutional:      General: She is not in acute distress.    Appearance: Normal appearance. She is normal weight. She is not ill-appearing, toxic-appearing or diaphoretic.  HENT:      Right Ear: Hearing, tympanic membrane, ear canal and external ear normal. No decreased hearing noted. No laceration, drainage, swelling or tenderness.     Left Ear: Hearing, tympanic membrane, ear canal and external ear normal. No decreased hearing noted. No laceration, drainage, swelling or tenderness.     Ears:     Weber exam findings: lateralizes left.    Right Rinne: AC > BC.    Left Rinne: AC > BC. Cardiovascular:     Rate and Rhythm: Normal rate and regular rhythm.     Pulses: Normal pulses.     Heart sounds: Normal heart sounds. No murmur. No friction rub. No gallop.   Pulmonary:     Effort: Pulmonary effort is normal. No respiratory distress.     Breath sounds: Normal breath sounds. No stridor. No wheezing, rhonchi or rales.  Chest:     Chest wall: No tenderness.  Skin:    General: Skin is warm and dry.     Capillary Refill: Capillary refill takes less than 2 seconds.     Coloration: Skin is not jaundiced or pale.     Findings: No bruising, erythema, lesion or rash.  Neurological:     General: No focal deficit present.     Mental Status: She is alert and oriented to person, place, and time. Mental status is at baseline.  Psychiatric:        Mood and Affect: Mood normal.        Behavior: Behavior normal.        Thought Content: Thought content normal.        Judgment: Judgment normal.     Assessment & Plan:   Problem List Items Addressed This Visit    None    Visit Diagnoses    Encounter to establish care    -  Primary   Tinnitus of both ears       Relevant Orders   Ambulatory referral to Audiology   History of elevated glucose       Relevant Orders   Hemoglobin A1c   History of elevated lipids       Relevant Orders   Lipid panel   Essential hypertension       Relevant Orders   CBC with Differential   Comprehensive metabolic panel   Special screening for malignant neoplasms, colon       Screening mammogram, encounter for       Relevant Orders   MM Digital  Diagnostic Bilat   Screening for viral disease       Relevant Orders   Hepatitis  C antibody   Screening for endocrine disorder       Relevant Orders   TSH   Rash and nonspecific skin eruption       Relevant Medications   triamcinolone cream (KENALOG) 0.1 %      Outpatient Encounter Medications as of 09/06/2019  Medication Sig  . fluticasone (FLONASE) 50 MCG/ACT nasal spray Place 1 spray into both nostrils 2 (two) times daily. (Patient taking differently: Place 1 spray into both nostrils daily as needed for allergies. )  . losartan (COZAAR) 25 MG tablet Take 1 tablet (25 mg total) by mouth daily.  . meclizine (ANTIVERT) 25 MG tablet Take 1 tablet (25 mg total) by mouth 2 (two) times daily as needed for dizziness.  . simvastatin (ZOCOR) 40 MG tablet Take 1 tablet (40 mg total) by mouth daily.  Marland Kitchen triamcinolone cream (KENALOG) 0.1 % Apply 1 application topically 2 (two) times daily.  . [DISCONTINUED] amLODipine (NORVASC) 5 MG tablet Take 2 tablets (10 mg total) by mouth daily. (Patient taking differently: Take 5 mg by mouth daily. )  . [DISCONTINUED] triamcinolone cream (KENALOG) 0.1 % Apply 1 application topically 2 (two) times daily.  . [DISCONTINUED] fexofenadine (ALLEGRA) 180 MG tablet Take 1 tablet (180 mg total) by mouth daily. (Patient taking differently: Take 180 mg by mouth daily as needed for allergies. )   No facility-administered encounter medications on file as of 09/06/2019.    Follow-up: No follow-ups on file.   PLAN  Labs collected - will follow up as warranted  Weber shows hearing lateralizes left mildly. AC>BC bilaterally on Rinne.   Pt will be referred to audiology  Mammogram order placed  Patient encouraged to call clinic with any questions, comments, or concerns.  Maximiano Coss, NP

## 2019-09-06 NOTE — Patient Instructions (Addendum)
[name] to meet you. I'm looking forward to working with you. Below, please find the COVID-19 vaccine information:   COVID-19 Vaccine Information can be found at: ShippingScam.co.uk For questions related to vaccine distribution or appointments, please email [email or web address] or call [phone].    If you have lab work done today you will be contacted with your lab results within the next 2 weeks.  If you have not heard from [place] then please contact us. The fastest way to get your results is to register for My Chart.   IF you received an x-ray today, you will receive an invoice from [facility]. Please contact [facility] at [phone] with questions or concerns regarding your invoice.   IF you received labwork today, you will receive an invoice from [facility]. Please contact [facility] at [phone] with questions or concerns regarding your invoice.   Our billing staff will not be able to assist you with questions regarding bills from these companies.  You will be contacted with the lab results as soon as they are available. The fastest way to get your results is to activate your My Chart account. Instructions are located on the last page of this paperwork. If you have not heard from [place] regarding the results in 2 weeks, please contact this office.

## 2019-09-07 LAB — CBC WITH DIFFERENTIAL/PLATELET
Basophils Absolute: 0 10*3/uL (ref 0.0–0.2)
Basos: 1 %
EOS (ABSOLUTE): 0.2 10*3/uL (ref 0.0–0.4)
Eos: 4 %
Hematocrit: 38.3 % (ref 34.0–46.6)
Hemoglobin: 12.4 g/dL (ref 11.1–15.9)
Immature Grans (Abs): 0 10*3/uL (ref 0.0–0.1)
Immature Granulocytes: 0 %
Lymphocytes Absolute: 1.3 10*3/uL (ref 0.7–3.1)
Lymphs: 28 %
MCH: 30.2 pg (ref 26.6–33.0)
MCHC: 32.4 g/dL (ref 31.5–35.7)
MCV: 93 fL (ref 79–97)
Monocytes Absolute: 0.2 10*3/uL (ref 0.1–0.9)
Monocytes: 4 %
Neutrophils Absolute: 2.9 10*3/uL (ref 1.4–7.0)
Neutrophils: 63 %
Platelets: 273 10*3/uL (ref 150–450)
RBC: 4.1 x10E6/uL (ref 3.77–5.28)
RDW: 12.4 % (ref 11.7–15.4)
WBC: 4.6 10*3/uL (ref 3.4–10.8)

## 2019-09-07 LAB — COMPREHENSIVE METABOLIC PANEL
ALT: 18 IU/L (ref 0–32)
AST: 20 IU/L (ref 0–40)
Albumin/Globulin Ratio: 1.3 (ref 1.2–2.2)
Albumin: 4.5 g/dL (ref 3.7–4.7)
Alkaline Phosphatase: 88 IU/L (ref 39–117)
BUN/Creatinine Ratio: 17 (ref 12–28)
BUN: 16 mg/dL (ref 8–27)
Bilirubin Total: 0.8 mg/dL (ref 0.0–1.2)
CO2: 23 mmol/L (ref 20–29)
Calcium: 9.7 mg/dL (ref 8.7–10.3)
Chloride: 104 mmol/L (ref 96–106)
Creatinine, Ser: 0.95 mg/dL (ref 0.57–1.00)
GFR calc Af Amer: 69 mL/min/{1.73_m2} (ref >59)
GFR calc non Af Amer: 60 mL/min/{1.73_m2} (ref >59)
Globulin, Total: 3.4 g/dL (ref 1.5–4.5)
Glucose: 114 mg/dL — ABNORMAL HIGH (ref 65–99)
Potassium: 4.3 mmol/L (ref 3.5–5.2)
Sodium: 141 mmol/L (ref 134–144)
Total Protein: 7.9 g/dL (ref 6.0–8.5)

## 2019-09-07 LAB — LIPID PANEL
Chol/HDL Ratio: 3.8 ratio (ref 0.0–4.4)
Cholesterol, Total: 181 mg/dL (ref 100–199)
HDL: 48 mg/dL (ref >39)
LDL Chol Calc (NIH): 106 mg/dL — ABNORMAL HIGH (ref 0–99)
Triglycerides: 155 mg/dL — ABNORMAL HIGH (ref 0–149)
VLDL Cholesterol Cal: 27 mg/dL (ref 5–40)

## 2019-09-07 LAB — TSH: TSH: 2.97 u[IU]/mL (ref 0.450–4.500)

## 2019-09-07 LAB — HEMOGLOBIN A1C
Est. average glucose Bld gHb Est-mCnc: 123 mg/dL
Hgb A1c MFr Bld: 5.9 % — ABNORMAL HIGH (ref 4.8–5.6)

## 2019-09-07 LAB — HEPATITIS C ANTIBODY: Hep C Virus Ab: <0.1 s/co ratio (ref 0.0–0.9)

## 2019-09-07 LAB — VITAMIN D 25 HYDROXY (VIT D DEFICIENCY, FRACTURES): Vit D, 25-Hydroxy: 16.1 ng/mL — ABNORMAL LOW (ref 30.0–100.0)

## 2019-09-08 ENCOUNTER — Other Ambulatory Visit: Payer: Self-pay | Admitting: Registered Nurse

## 2019-09-08 DIAGNOSIS — Z8639 Personal history of other endocrine, nutritional and metabolic disease: Secondary | ICD-10-CM

## 2019-09-08 MED ORDER — VITAMIN D (ERGOCALCIFEROL) 1.25 MG (50000 UNIT) PO CAPS: 50000.0000 [IU] | ORAL_CAPSULE | ORAL | 0 refills | Status: DC

## 2019-09-08 NOTE — Progress Notes (Signed)
Howdy,  If we could give Susan Berg a call to let her know her labs are back, that'd be great. Overall, they're pretty good, but she does have a decrease in her Vit D level that I want to supplement with once weekly tablets of 50,000 units for the next six weeks. After that, she should come in for a lab only visit to check her Vit D. I will place both of these orders shortly.  Thank you!  Kathrin Ruddy, NP

## 2019-09-15 ENCOUNTER — Other Ambulatory Visit: Payer: Self-pay

## 2019-09-15 ENCOUNTER — Encounter (HOSPITAL_COMMUNITY): Payer: Self-pay

## 2019-09-15 ENCOUNTER — Ambulatory Visit (HOSPITAL_COMMUNITY)
Admission: EM | Admit: 2019-09-15 | Discharge: 2019-09-15 | Disposition: A | Discharge status: Home / Self Care | Payer: Medicare (Managed Care) | Attending: Internal Medicine | Admitting: Internal Medicine

## 2019-09-15 DIAGNOSIS — B349 Viral infection, unspecified: Secondary | ICD-10-CM | POA: Diagnosis present

## 2019-09-15 DIAGNOSIS — Z20822 Contact with and (suspected) exposure to covid-19: Secondary | ICD-10-CM | POA: Insufficient documentation

## 2019-09-15 HISTORY — DX: Vitamin D deficiency, unspecified: E55.9

## 2019-09-15 NOTE — Discharge Instructions (Signed)
Quarantine until covid-19 results are available. 

## 2019-09-15 NOTE — ED Triage Notes (Signed)
Pt c/o body aches, dry cough, and HAx1 wk. Pt denies SOB. Pt has non labored breathing. Skin color WNL. Lungs clear.

## 2019-09-16 ENCOUNTER — Other Ambulatory Visit: Payer: Self-pay

## 2019-09-16 ENCOUNTER — Encounter: Payer: Self-pay | Admitting: Registered Nurse

## 2019-09-16 ENCOUNTER — Telehealth (INDEPENDENT_AMBULATORY_CARE_PROVIDER_SITE_OTHER): Payer: Medicare (Managed Care) | Admitting: Registered Nurse

## 2019-09-16 DIAGNOSIS — U071 COVID-19: Secondary | ICD-10-CM

## 2019-09-16 LAB — SARS CORONAVIRUS 2 (TAT 6-24 HRS): SARS Coronavirus 2: POSITIVE — AB

## 2019-09-16 MED ORDER — BENZONATATE 100 MG PO CAPS: 100.0000 mg | ORAL_CAPSULE | Freq: Two times a day (BID) | ORAL | 0 refills | Status: DC | PRN

## 2019-09-16 NOTE — ED Provider Notes (Signed)
[place]    CSN: [id number] Arrival date & time: 09/15/19  1411      History   Chief Complaint Chief Complaint  Patient presents with  . COVID symptoms    HPI Susan Berg is a 74 y.o. female comes to urgent care with 1 week history of generalized headaches, nonproductive cough, generalized body aches.  Patient denies any sick contacts.  No fever or chills.  No nausea vomiting or diarrhea.  No loss of taste or smell.  Patient has not been vaccinated against COVID-19.   HPI  Past Medical History:  Diagnosis Date  . High cholesterol   . Hypertension   . Vertigo   . Vitamin D deficiency     Patient Active Problem List   Diagnosis Date Noted  . Tinnitus of both ears 09/06/2019  . Rash and nonspecific skin eruption 09/06/2019    Past Surgical History:  Procedure Laterality Date  . ANKLE SURGERY      OB History   No obstetric history on file.      Home Medications    Prior to Admission medications   Medication Sig Start Date End Date Taking? Authorizing Provider  losartan (COZAAR) 25 MG tablet Take 1 tablet (25 mg total) by mouth daily. 08/12/19   Raylene Everts, MD  meclizine (ANTIVERT) 25 MG tablet Take 1 tablet (25 mg total) by mouth 2 (two) times daily as needed for dizziness. 08/12/19   Raylene Everts, MD  simvastatin (ZOCOR) 40 MG tablet Take 1 tablet (40 mg total) by mouth daily. 08/12/19   Raylene Everts, MD  triamcinolone cream (KENALOG) 0.1 % Apply 1 application topically 2 (two) times daily. 09/06/19   Maximiano Coss, NP  Vitamin D, Ergocalciferol, (DRISDOL) 1.25 MG (50000 UNIT) CAPS capsule Take 1 capsule (50,000 Units total) by mouth every 7 (seven) days. 09/08/19   Maximiano Coss, NP  amLODipine (NORVASC) 5 MG tablet Take 2 tablets (10 mg total) by mouth daily. Patient taking differently: Take 5 mg by mouth daily.  06/01/14 09/06/19  Milton Ferguson, MD  fexofenadine (ALLEGRA) 180 MG tablet Take 1 tablet (180 mg total) by mouth  daily. Patient taking differently: Take 180 mg by mouth daily as needed for allergies.  08/22/15 04/15/19  Billy Fischer, MD  fluticasone (FLONASE) 50 MCG/ACT nasal spray Place 1 spray into both nostrils 2 (two) times daily. Patient taking differently: Place 1 spray into both nostrils daily as needed for allergies.  08/22/15 09/15/19  Billy Fischer, MD    Family History Family History  Problem Relation Age of Onset  . Diabetes Mother   . Rheum arthritis Mother   . Stroke Mother   . Cancer Father     Social History Social History   Tobacco Use  . Smoking status: Never Smoker  . Smokeless tobacco: Never Used  Substance Use Topics  . Alcohol use: No  . Drug use: No     Allergies   Codeine, Vytorin [ezetimibe-simvastatin], and Lipitor [atorvastatin]   Review of Systems Review of Systems  Constitutional: Negative.   Respiratory: Positive for cough. Negative for shortness of breath and wheezing.   Cardiovascular: Negative.   Gastrointestinal: Negative.  Negative for abdominal pain, diarrhea, nausea and vomiting.  Musculoskeletal: Positive for arthralgias and myalgias. Negative for joint swelling.  Skin: Negative.  Negative for wound.  Neurological: Positive for headaches. Negative for dizziness, weakness and numbness.     Physical Exam Triage Vital Signs ED Triage Vitals [09/15/19  1432]  Enc Vitals Group     BP 128/75     Pulse Rate (!) 117     Resp 18     Temp 98.3 F (36.8 C)     Temp Source Oral     SpO2 97 %     Weight 178 lb (80.7 kg)     Height 5\' 5"  (1.651 m)     Head Circumference      Peak Flow      Pain Score 6     Pain Loc      Pain Edu?      Excl. in [place]?    No data found.  Updated Vital Signs BP 128/75   Pulse (!) 117   Temp 98.3 F (36.8 C) (Oral)   Resp 18   Ht 5\' 5"  (1.651 m)   Wt 80.7 kg   SpO2 97%   BMI 29.62 kg/m   Visual Acuity Right Eye Distance:   Left Eye Distance:   Bilateral Distance:    Right Eye Near:   Left Eye Near:     Bilateral Near:     Physical Exam Vitals and nursing note reviewed.  Constitutional:      General: She is not in acute distress.    Appearance: She is not ill-appearing.  HENT:     Right Ear: Tympanic membrane normal.     Left Ear: Tympanic membrane normal.     Nose: Nose normal. No congestion or rhinorrhea.     Mouth/Throat:     Mouth: Mucous membranes are moist.     Pharynx: No posterior oropharyngeal erythema.  Cardiovascular:     Rate and Rhythm: Normal rate and regular rhythm.     Pulses: Normal pulses.     Heart sounds: Normal heart sounds.  Pulmonary:     Effort: Pulmonary effort is normal.     Breath sounds: Normal breath sounds.  Abdominal:     General: Bowel sounds are normal.     Palpations: Abdomen is soft.  Musculoskeletal:        General: Normal range of motion.  Skin:    General: Skin is warm.     Capillary Refill: Capillary refill takes less than 2 seconds.  Neurological:     Mental Status: She is alert.      UC Treatments / Results  Labs (all labs ordered are listed, but only abnormal results are displayed) Labs Reviewed  SARS CORONAVIRUS 2 (TAT 6-24 HRS) - Abnormal; Notable for the following components:      Result Value   SARS Coronavirus 2 POSITIVE (*)    All other components within normal limits    EKG   Radiology No results found.  Procedures Procedures (including critical care time)  Medications Ordered in UC Medications - No data to display  Initial Impression / Assessment and Plan / UC Course  I have reviewed the triage vital signs and the nursing notes.  Pertinent labs & imaging results that were available during my care of the patient were reviewed by me and considered in my medical decision making (see chart for details).     1.  Viral syndrome: COVID-19 PCR sent Patient is encouraged to consider getting the COVID-19 vaccine Patient is advised to quarantine until COVID-19 test results are available Tylenol/Motrin for  headache, generalized body pains and/or fever. Return precautions given. Final Clinical Impressions(s) / UC Diagnoses   Final diagnoses:  Viral syndrome     Discharge Instructions  Quarantine until covid-19 results are available.   ED Prescriptions    None     PDMP not reviewed this encounter.   Chase Picket, MD 09/16/19 [phone]

## 2019-09-16 NOTE — Patient Instructions (Signed)
° ° ° °  If you have lab work done today you will be contacted with your lab results within the next 2 weeks.  If you have not heard from us then please contact us. The fastest way to get your results is to register for My Chart. ° ° °IF you received an x-ray today, you will receive an invoice from [facility]. Please contact [facility] at [phone] with questions or concerns regarding your invoice.  ° °IF you received labwork today, you will receive an invoice from [facility]. Please contact [facility] at [phone] with questions or concerns regarding your invoice.  ° °Our billing staff will not be able to assist you with questions regarding bills from these companies. ° °You will be contacted with the lab results as soon as they are available. The fastest way to get your results is to activate your My Chart account. Instructions are located on the last page of this paperwork. If you have not heard from us regarding the results in 2 weeks, please contact this office. °  ° ° ° °

## 2019-09-17 ENCOUNTER — Telehealth: Payer: Self-pay | Admitting: Nurse Practitioner

## 2019-09-17 NOTE — Telephone Encounter (Signed)
Called to discuss with Susan Berg about Covid symptoms and the use of bamlanivimab, a monoclonal antibody infusion for those with mild to moderate Covid symptoms and at a high risk of hospitalization.     Pt is qualified for this infusion at the [facility] infusion center due to co-morbid conditions and/or a member of an at-risk group (age >75).   Unable to reach. Message left.    Patient Active Problem List   Diagnosis Date Noted  . Tinnitus of both ears 09/06/2019  . Rash and nonspecific skin eruption 09/06/2019    Susan Berg, AGPCNP-BC Pager: [phone] Amion: N. Cousar

## 2019-09-21 ENCOUNTER — Other Ambulatory Visit: Payer: Self-pay

## 2019-09-21 ENCOUNTER — Emergency Department (HOSPITAL_COMMUNITY)
Admission: EM | Admit: 2019-09-21 | Discharge: 2019-09-22 | Disposition: A | Discharge status: Home / Self Care | Payer: Medicare (Managed Care) | Attending: Emergency Medicine | Admitting: Emergency Medicine

## 2019-09-21 ENCOUNTER — Encounter (HOSPITAL_COMMUNITY): Payer: Self-pay

## 2019-09-21 ENCOUNTER — Emergency Department (HOSPITAL_COMMUNITY): Payer: Medicare (Managed Care)

## 2019-09-21 DIAGNOSIS — U071 COVID-19: Secondary | ICD-10-CM | POA: Diagnosis not present

## 2019-09-21 DIAGNOSIS — R630 Anorexia: Secondary | ICD-10-CM | POA: Diagnosis not present

## 2019-09-21 DIAGNOSIS — I10 Essential (primary) hypertension: Secondary | ICD-10-CM | POA: Diagnosis not present

## 2019-09-21 DIAGNOSIS — R0602 Shortness of breath: Secondary | ICD-10-CM

## 2019-09-21 DIAGNOSIS — Z79899 Other long term (current) drug therapy: Secondary | ICD-10-CM | POA: Diagnosis not present

## 2019-09-21 DIAGNOSIS — J189 Pneumonia, unspecified organism: Secondary | ICD-10-CM

## 2019-09-21 MED ORDER — ALBUTEROL SULFATE HFA 108 (90 BASE) MCG/ACT IN AERS
2.0000 | INHALATION_SPRAY | Freq: Once | RESPIRATORY_TRACT | Status: AC
  Administered 2019-09-21: 2 via RESPIRATORY_TRACT
  Filled 2019-09-21: qty 6.7

## 2019-09-21 MED ORDER — BENZONATATE 100 MG PO CAPS: 100.0000 mg | ORAL_CAPSULE | Freq: Three times a day (TID) | ORAL | 0 refills | Status: DC

## 2019-09-21 NOTE — ED Notes (Signed)
Pt ambulated around the room without assistance. SpO2 went down to 93% on room air.

## 2019-09-21 NOTE — ED Triage Notes (Signed)
Per ems: Pt coming in c/o shortness of breath and decrease app.  Dx with covid on 09/14/19. On room air. No acute distress.

## 2019-09-21 NOTE — ED Provider Notes (Signed)
[facility] DEPT Provider Note   CSN: [id number] Arrival date & time: 09/21/19  2221     History Chief Complaint  Patient presents with  . Shortness of Breath    COVID +    Susan Berg is a 74 y.o. female with PMHx HTN, high cholesterol, vertigo who presents to the ED today via EMS with complaint of gradual onset, constant, SOB that began today.  She reports she tested positive for COVID-19 on 5/5.  She went to go get tested because her boyfriend tested positive.  She was not having any symptoms until today.  Ports she felt like she could not get a deep breath and prompting her to call EMS.  Satting 97% on room air without difficulty.  Patient reports she has been taking Robitussin for her cough however has not been taking anything else.  She states that she has not been eating or drinking much because the food smells weird.  She denies fevers, chills, chest pain, hemoptysis, abdominal pain, nausea, vomiting, diarrhea, any other associated symptoms.   The history is provided by the patient and medical records.       Past Medical History:  Diagnosis Date  . High cholesterol   . Hypertension   . Vertigo   . Vitamin D deficiency     Patient Active Problem List   Diagnosis Date Noted  . Tinnitus of both ears 09/06/2019  . Rash and nonspecific skin eruption 09/06/2019    Past Surgical History:  Procedure Laterality Date  . ANKLE SURGERY       OB History   No obstetric history on file.     Family History  Problem Relation Age of Onset  . Diabetes Mother   . Rheum arthritis Mother   . Stroke Mother   . Cancer Father     Social History   Tobacco Use  . Smoking status: Never Smoker  . Smokeless tobacco: Never Used  Substance Use Topics  . Alcohol use: No  . Drug use: No    Home Medications Prior to Admission medications   Medication Sig Start Date End Date Taking? Authorizing Provider  benzonatate (TESSALON) 100 MG capsule  Take 1 capsule (100 mg total) by mouth 2 (two) times daily as needed for cough. 09/16/19   Maximiano Coss, NP  benzonatate (TESSALON) 100 MG capsule Take 1 capsule (100 mg total) by mouth every 8 (eight) hours. 09/21/19   Alroy Bailiff, Issa Luster, PA-C  losartan (COZAAR) 25 MG tablet Take 1 tablet (25 mg total) by mouth daily. 08/12/19   Raylene Everts, MD  meclizine (ANTIVERT) 25 MG tablet Take 1 tablet (25 mg total) by mouth 2 (two) times daily as needed for dizziness. 08/12/19   Raylene Everts, MD  simvastatin (ZOCOR) 40 MG tablet Take 1 tablet (40 mg total) by mouth daily. 08/12/19   Raylene Everts, MD  triamcinolone cream (KENALOG) 0.1 % Apply 1 application topically 2 (two) times daily. 09/06/19   Maximiano Coss, NP  Vitamin D, Ergocalciferol, (DRISDOL) 1.25 MG (50000 UNIT) CAPS capsule Take 1 capsule (50,000 Units total) by mouth every 7 (seven) days. 09/08/19   Maximiano Coss, NP  amLODipine (NORVASC) 5 MG tablet Take 2 tablets (10 mg total) by mouth daily. Patient taking differently: Take 5 mg by mouth daily.  06/01/14 09/06/19  Milton Ferguson, MD  fexofenadine (ALLEGRA) 180 MG tablet Take 1 tablet (180 mg total) by mouth daily. Patient taking differently: Take 180 mg by mouth daily  as needed for allergies.  08/22/15 04/15/19  Billy Fischer, MD  fluticasone (FLONASE) 50 MCG/ACT nasal spray Place 1 spray into both nostrils 2 (two) times daily. Patient taking differently: Place 1 spray into both nostrils daily as needed for allergies.  08/22/15 09/15/19  Billy Fischer, MD    Allergies    Codeine, Vytorin [ezetimibe-simvastatin], and Lipitor [atorvastatin]  Review of Systems   Review of Systems  Constitutional: Negative for chills and fever.  Respiratory: Positive for cough and shortness of breath.   Cardiovascular: Negative for chest pain and leg swelling.  Gastrointestinal: Negative for abdominal pain, diarrhea, nausea and vomiting.  All other systems reviewed and are negative.   Physical  Exam Updated Vital Signs BP 110/81 (BP Location: Left Arm)   Pulse (!) 113   Temp 98.5 F (36.9 C) (Oral)   Resp (!) 22   SpO2 98%   Physical Exam Vitals and nursing note reviewed.  Constitutional:      Appearance: She is not ill-appearing or diaphoretic.  HENT:     Head: Normocephalic and atraumatic.  Eyes:     Conjunctiva/sclera: Conjunctivae normal.  Cardiovascular:     Rate and Rhythm: Regular rhythm. Tachycardia present.  Pulmonary:     Effort: Pulmonary effort is normal.     Breath sounds: Normal breath sounds. No decreased breath sounds, wheezing, rhonchi or rales.     Comments: Able to speak in full sentences without difficulty. No accessory muscle use. Satting 97% on RA. Abdominal:     Palpations: Abdomen is soft.     Tenderness: There is no abdominal tenderness.  Musculoskeletal:     Cervical back: Neck supple.  Skin:    General: Skin is warm and dry.  Neurological:     Mental Status: She is alert.     ED Results / Procedures / Treatments   Labs (all labs ordered are listed, but only abnormal results are displayed) Labs Reviewed - No data to display  EKG EKG Interpretation  Date/Time:  Wednesday Sep 21 2019 23:12:52 EDT Ventricular Rate:  103 PR Interval:    QRS Duration: 87 QT Interval:  332 QTC Calculation: 435 R Axis:   15 Text Interpretation: Sinus tachycardia Consider left atrial enlargement Low voltage, precordial leads No significant change was found Confirmed by Shanon Rosser [phone]) on 09/21/2019 11:35:31 PM   Radiology DG Chest Port 1 View  Result Date: 09/21/2019 CLINICAL DATA:  COVID positive EXAM: PORTABLE CHEST 1 VIEW COMPARISON:  May 22, 2016 FINDINGS: The heart size and mediastinal contours are within normal limits given degree of rotation. There is shallow degree of aeration with subsegmental atelectasis at the left lung base. Hazy airspace opacity seen within the right infrahilar region. There is also mildly increased airspace  opacity in the periphery of the right upper lung. No pleural effusion. No acute osseous abnormality. IMPRESSION: Multifocal airspace opacities in the right lung which could be due to multifocal pneumonia. Electronically Signed   By: Prudencio Pair M.D.   On: 09/21/2019 23:00    Procedures Procedures (including critical care time)  Medications Ordered in ED Medications  albuterol (VENTOLIN HFA) 108 (90 Base) MCG/ACT inhaler 2 puff (2 puffs Inhalation Given 09/21/19 2306)    ED Course  I have reviewed the triage vital signs and the nursing notes.  Pertinent labs & imaging results that were available during my care of the patient were reviewed by me and considered in my medical decision making (see chart for details).  MDM Rules/Calculators/A&P                      74 year old female presents to the ED today with complaint of shortness of breath that began today.  Diagnosed with COVID-19 on 5/5 after her boyfriend tested positive however did not start having symptoms until today.  On arrival to the ED patient is afebrile.  She is tachycardic in the 110s and mildly tachypneic at 22.  She appears to be in no acute distress.  She is able to speak in full sentences without difficulty.  Satting 98% on room air.  Work-up for shortness of breath at this time with EKG and chest x-ray.  Plan to ambulate patient to ensure she does not desaturate.  Given her tachycardia suspect this is likely due to dehydration.  Will have patient drink oral fluids and reassess.  Would to avoid pumping full of fluids due to COVID-19 positive status to avoid worsening shortness of breath.  Discussed case with attending physician Dr. Maurie Boettcher who agrees with plan.  Without any complaint of chest pain.  Do not feel she needs additional work-up.  EKG with sinus tachycardia.  No significant change from last tracing. Chest x-ray with findings of multifocal pneumonia in the right side.  Hold off on treatment at this time as her  symptoms are related to COVID-19 virus.   Patient ambulated in the ED and oxygen saturation remained above 93%.  She was provided albuterol inhaler and reports mild improvement in symptoms.  Her heart rate has decreased to the low 100s with oral fluids.  Have encouraged increasing oral fluids at home.  Will discharge home with her albuterol inhaler as well as Tessalon Perles.  I have provided patient's contact information to monoclonal antibody infusion clinic due to age and findings on chest x-ray I feel she would be a good candidate for Mab.  She has not been vaccinated against COVID-19 I am concerned she could do worse.  I had lengthy discussion with patient regarding strict return precautions including worsening shortness of breath, fast breathing, chest pain, cyanosis, dizziness, lightheadedness, presyncope, syncope.  Patient is in agreement with plan and stable for discharge home at this time.   This note was prepared using Dragon voice recognition software and may include unintentional dictation errors due to the inherent limitations of voice recognition software.  Susan Berg was evaluated in Emergency Department on 09/21/2019 for the symptoms described in the history of present illness. She was evaluated in the context of the global COVID-19 pandemic, which necessitated consideration that the patient might be at risk for infection with the SARS-CoV-2 virus that causes COVID-19. Institutional protocols and algorithms that pertain to the evaluation of patients at risk for COVID-19 are in a state of rapid change based on information released by regulatory bodies including the CDC and federal and state organizations. These policies and algorithms were followed during the patient's care in the ED.  Final Clinical Impression(s) / ED Diagnoses Final diagnoses:  COVID-19  Shortness of breath  Multifocal pneumonia    Rx / DC Orders ED Discharge Orders         Ordered    benzonatate (TESSALON)  100 MG capsule  Every 8 hours     09/21/19 2348           Discharge Instructions     Please continue monitoring your symptoms at home Check your temperature and take Tylenol as needed for fevers > 100.4 Use the  albuterol inhaler as needed for shortness of breath. Take cough medication as indicated for cough.  Continue self isolating at home until your quarantine period is over I have provided your information to the [facility] [place] and they should be contacting you in the next 1-2 days to discuss options Return to the ED IMMEDIATELY for any worsening symptoms including worsening shortness of breath, rapid fast breathing, blueish tinge around your fingernails or mouth, chest pain, coughing up blood, dizziness/lightheadedness, feeling like you could pass out or if you pass out       Eustaquio Maize, PA-C 09/21/19 2354    Maudie Flakes, MD 09/22/19 0045

## 2019-09-21 NOTE — Discharge Instructions (Signed)
Please continue monitoring your symptoms at home Check your temperature and take Tylenol as needed for fevers > 100.4 Use the albuterol inhaler as needed for shortness of breath. Take cough medication as indicated for cough.  Continue self isolating at home until your quarantine period is over I have provided your information to the [facility] [place] and they should be contacting you in the next 1-2 days to discuss options Return to the ED IMMEDIATELY for any worsening symptoms including worsening shortness of breath, rapid fast breathing, blueish tinge around your fingernails or mouth, chest pain, coughing up blood, dizziness/lightheadedness, feeling like you could pass out or if you pass out

## 2019-09-22 ENCOUNTER — Telehealth: Payer: Self-pay | Admitting: Unknown Physician Specialty

## 2019-09-22 ENCOUNTER — Other Ambulatory Visit: Payer: Self-pay | Admitting: Unknown Physician Specialty

## 2019-09-22 DIAGNOSIS — U071 COVID-19: Secondary | ICD-10-CM

## 2019-09-22 NOTE — ED Notes (Signed)
Pt's ride is here to get her. Alert and ambulatory.  No iv. No acute distress

## 2019-09-22 NOTE — ED Notes (Signed)
[id number] Francee Piccolo

## 2019-09-22 NOTE — ED Notes (Signed)
Pt verbalize d/c instructions and follow up care. Alert and ambulatory. No iv. Leaving with family

## 2019-09-22 NOTE — Telephone Encounter (Signed)
  I connected by phone with Susan Berg on 09/22/2019 at 8:47 AM to discuss the potential use of an new treatment for mild to moderate COVID-19 viral infection in non-hospitalized patients.  This patient is a 74 y.o. female that meets the FDA criteria for Emergency Use Authorization of bamlanivimab/etesevimab or casirivimab/imdevimab.  Has a (+) direct SARS-CoV-2 viral test result  Has mild or moderate COVID-19   Is ? 72 years of age and weighs ? 40 kg  Is NOT hospitalized due to COVID-19  Is NOT requiring oxygen therapy or requiring an increase in baseline oxygen flow rate due to COVID-19  Is within 10 days of symptom onset  Has at least one of the high risk factor(s) for progression to severe COVID-19 and/or hospitalization as defined in EUA.  Specific high risk criteria : >/= 72 yo   I have spoken and communicated the following to the patient or parent/caregiver:  1. FDA has authorized the emergency use of bamlanivimab/etesevimab and casirivimab\imdevimab for the treatment of mild to moderate COVID-19 in adults and pediatric patients with positive results of direct SARS-CoV-2 viral testing who are 29 years of age and older weighing at least 40 kg, and who are at high risk for progressing to severe COVID-19 and/or hospitalization.  2. The significant known and potential risks and benefits of bamlanivimab/etesevimab and casirivimab\imdevimab, and the extent to which such potential risks and benefits are unknown.  3. Information on available alternative treatments and the risks and benefits of those alternatives, including clinical trials.  4. Patients treated with bamlanivimab/etesevimab and casirivimab\imdevimab should continue to self-isolate and use infection control measures (e.g., wear mask, isolate, social distance, avoid sharing personal items, clean and disinfect "high touch" surfaces, and frequent handwashing) according to CDC guidelines.   5. The patient or  parent/caregiver has the option to accept or refuse bamlanivimab/etesevimab or casirivimab\imdevimab .  After reviewing this information with the patient, The patient agreed to proceed with receiving the bamlanimivab infusion and will be provided a copy of the Fact sheet prior to receiving the infusion.Kathrine Haddock 09/22/2019 8:47 AM

## 2019-09-22 NOTE — ED Notes (Signed)
Attempted to get pt a ride home. Family said they would have to call back

## 2019-09-23 ENCOUNTER — Ambulatory Visit (HOSPITAL_COMMUNITY)
Admission: RE | Admit: 2019-09-23 | Discharge: 2019-09-23 | Disposition: A | Discharge status: Home / Self Care | Payer: Medicare Other | Source: Ambulatory Visit | Attending: Pulmonary Disease | Admitting: Pulmonary Disease

## 2019-09-23 DIAGNOSIS — Z23 Encounter for immunization: Secondary | ICD-10-CM | POA: Diagnosis present

## 2019-09-23 DIAGNOSIS — U071 COVID-19: Secondary | ICD-10-CM | POA: Diagnosis not present

## 2019-09-23 MED ORDER — SODIUM CHLORIDE 0.9 % IV SOLN: INTRAVENOUS | Status: DC | PRN

## 2019-09-23 MED ORDER — ALBUTEROL SULFATE HFA 108 (90 BASE) MCG/ACT IN AERS: 2.0000 | INHALATION_SPRAY | Freq: Once | RESPIRATORY_TRACT | Status: DC | PRN

## 2019-09-23 MED ORDER — FAMOTIDINE IN NACL 20-0.9 MG/50ML-% IV SOLN: 20.0000 mg | Freq: Once | INTRAVENOUS | Status: DC | PRN

## 2019-09-23 MED ORDER — EPINEPHRINE 0.3 MG/0.3ML IJ SOAJ: 0.3000 mg | Freq: Once | INTRAMUSCULAR | Status: DC | PRN

## 2019-09-23 MED ORDER — DIPHENHYDRAMINE HCL 50 MG/ML IJ SOLN: 50.0000 mg | Freq: Once | INTRAMUSCULAR | Status: DC | PRN

## 2019-09-23 MED ORDER — SODIUM CHLORIDE 0.9 % IV SOLN
Freq: Once | INTRAVENOUS | Status: AC
  Filled 2019-09-23: qty 700

## 2019-09-23 MED ORDER — METHYLPREDNISOLONE SODIUM SUCC 125 MG IJ SOLR: 125.0000 mg | Freq: Once | INTRAMUSCULAR | Status: DC | PRN

## 2019-09-23 NOTE — Discharge Instructions (Signed)

## 2019-09-23 NOTE — Progress Notes (Signed)
  Diagnosis: COVID-19  Physician: Dr. Wright  Procedure: Covid Infusion Clinic Med: bamlanivimab\etesevimab infusion - Provided patient with bamlanimivab\etesevimab fact sheet for patients, parents and caregivers prior to infusion.  Complications: No immediate complications noted.  Discharge: Discharged home   Susan Berg 09/23/2019  

## 2019-10-03 ENCOUNTER — Other Ambulatory Visit: Payer: Self-pay

## 2019-10-03 ENCOUNTER — Ambulatory Visit (HOSPITAL_COMMUNITY)
Admission: EM | Admit: 2019-10-03 | Discharge: 2019-10-03 | Disposition: A | Discharge status: Home / Self Care | Payer: Medicare Other | Attending: Family Medicine | Admitting: Family Medicine

## 2019-10-03 ENCOUNTER — Encounter (HOSPITAL_COMMUNITY): Payer: Self-pay

## 2019-10-03 DIAGNOSIS — W57XXXA Bitten or stung by nonvenomous insect and other nonvenomous arthropods, initial encounter: Secondary | ICD-10-CM

## 2019-10-03 DIAGNOSIS — S60463A Insect bite (nonvenomous) of left middle finger, initial encounter: Secondary | ICD-10-CM

## 2019-10-03 DIAGNOSIS — S40261A Insect bite (nonvenomous) of right shoulder, initial encounter: Secondary | ICD-10-CM

## 2019-10-03 MED ORDER — TRIAMCINOLONE ACETONIDE 0.1 % EX CREA
1.0000 | TOPICAL_CREAM | Freq: Two times a day (BID) | CUTANEOUS | 0 refills | Status: DC
Start: 2019-10-03 — End: 2020-02-21

## 2019-10-03 NOTE — ED Triage Notes (Signed)
Pt presents with insect bite on right side of neck and left finger from yesterday.

## 2019-10-03 NOTE — ED Provider Notes (Signed)
[place]    CSN: [id number] Arrival date & time: 10/03/19  1553      History   Chief Complaint Chief Complaint  Patient presents with  . Insect Bite    HPI Susan Berg is a 74 y.o. female history of hypertension presenting today for evaluation of possible insect bite.  Patient reports that she was driving on the highway, felt the bug come in and bite or sting her on her right shoulder area as well as her left middle finger.  Since she has had associated itching.  Reports the area on her shoulder was more swollen, but this is much improved.  This happened yesterday.  She denies any fevers dizziness or lightheadedness.  HPI  Past Medical History:  Diagnosis Date  . High cholesterol   . Hypertension   . Vertigo   . Vitamin D deficiency     Patient Active Problem List   Diagnosis Date Noted  . Tinnitus of both ears 09/06/2019  . Rash and nonspecific skin eruption 09/06/2019    Past Surgical History:  Procedure Laterality Date  . ANKLE SURGERY      OB History   No obstetric history on file.      Home Medications    Prior to Admission medications   Medication Sig Start Date End Date Taking? Authorizing Provider  benzonatate (TESSALON) 100 MG capsule Take 1 capsule (100 mg total) by mouth 2 (two) times daily as needed for cough. 09/16/19   Maximiano Coss, NP  benzonatate (TESSALON) 100 MG capsule Take 1 capsule (100 mg total) by mouth every 8 (eight) hours. 09/21/19   Alroy Bailiff, Margaux, PA-C  losartan (COZAAR) 25 MG tablet Take 1 tablet (25 mg total) by mouth daily. 08/12/19   Raylene Everts, MD  meclizine (ANTIVERT) 25 MG tablet Take 1 tablet (25 mg total) by mouth 2 (two) times daily as needed for dizziness. 08/12/19   Raylene Everts, MD  simvastatin (ZOCOR) 40 MG tablet Take 1 tablet (40 mg total) by mouth daily. 08/12/19   Raylene Everts, MD  triamcinolone cream (KENALOG) 0.1 % Apply 1 application topically 2 (two) times daily. 10/03/19    Advith Martine C, PA-C  Vitamin D, Ergocalciferol, (DRISDOL) 1.25 MG (50000 UNIT) CAPS capsule Take 1 capsule (50,000 Units total) by mouth every 7 (seven) days. 09/08/19   Maximiano Coss, NP  amLODipine (NORVASC) 5 MG tablet Take 2 tablets (10 mg total) by mouth daily. Patient taking differently: Take 5 mg by mouth daily.  06/01/14 09/06/19  Milton Ferguson, MD  fexofenadine (ALLEGRA) 180 MG tablet Take 1 tablet (180 mg total) by mouth daily. Patient taking differently: Take 180 mg by mouth daily as needed for allergies.  08/22/15 04/15/19  Billy Fischer, MD  fluticasone (FLONASE) 50 MCG/ACT nasal spray Place 1 spray into both nostrils 2 (two) times daily. Patient taking differently: Place 1 spray into both nostrils daily as needed for allergies.  08/22/15 09/15/19  Billy Fischer, MD    Family History Family History  Problem Relation Age of Onset  . Diabetes Mother   . Rheum arthritis Mother   . Stroke Mother   . Cancer Father     Social History Social History   Tobacco Use  . Smoking status: Never Smoker  . Smokeless tobacco: Never Used  Substance Use Topics  . Alcohol use: No  . Drug use: No     Allergies   Codeine, Vytorin [ezetimibe-simvastatin], and Lipitor [atorvastatin]  Review of Systems Review of Systems  Constitutional: Negative for fatigue and fever.  HENT: Negative for mouth sores.   Eyes: Negative for visual disturbance.  Respiratory: Negative for shortness of breath.   Cardiovascular: Negative for chest pain.  Gastrointestinal: Negative for abdominal pain, nausea and vomiting.  Musculoskeletal: Negative for arthralgias and joint swelling.  Skin: Positive for color change. Negative for rash and wound.  Neurological: Negative for dizziness, weakness, light-headedness and headaches.     Physical Exam Triage Vital Signs ED Triage Vitals  Enc Vitals Group     BP 10/03/19 1712 137/83     Pulse Rate 10/03/19 1712 (!) 115     Resp 10/03/19 1712 16     Temp  10/03/19 1712 98.3 F (36.8 C)     Temp Source 10/03/19 1712 Oral     SpO2 10/03/19 1712 100 %     Weight --      Height --      Head Circumference --      Peak Flow --      Pain Score 10/03/19 1720 1     Pain Loc --      Pain Edu? --      Excl. in [place]? --    No data found.  Updated Vital Signs BP 137/83 (BP Location: Right Arm)   Pulse (!) 115   Temp 98.3 F (36.8 C) (Oral)   Resp 16   SpO2 100%   Visual Acuity Right Eye Distance:   Left Eye Distance:   Bilateral Distance:    Right Eye Near:   Left Eye Near:    Bilateral Near:     Physical Exam Vitals and nursing note reviewed.  Constitutional:      Appearance: She is well-developed.     Comments: No acute distress  HENT:     Head: Normocephalic and atraumatic.     Nose: Nose normal.  Eyes:     Conjunctiva/sclera: Conjunctivae normal.  Cardiovascular:     Rate and Rhythm: Tachycardia present.     Comments: Heart rate rechecked and was 103 Pulmonary:     Effort: Pulmonary effort is normal. No respiratory distress.  Abdominal:     General: There is no distension.  Musculoskeletal:        General: Normal range of motion.     Cervical back: Neck supple.     Comments: Right superior trapezius area with area of erythema swelling and slight firmness, no fluctuance; full active range of motion of right shoulder and neck with rightward and leftward rotation  Left middle finger with erythema without warmth over PIP, full active range of motion  Skin:    General: Skin is warm and dry.  Neurological:     Mental Status: She is alert and oriented to person, place, and time.      UC Treatments / Results  Labs (all labs ordered are listed, but only abnormal results are displayed) Labs Reviewed - No data to display  EKG   Radiology No results found.  Procedures Procedures (including critical care time)  Medications Ordered in UC Medications - No data to display  Initial Impression / Assessment and Plan /  UC Course  I have reviewed the triage vital signs and the nursing notes.  Pertinent labs & imaging results that were available during my care of the patient were reviewed by me and considered in my medical decision making (see chart for details).    Suspect likely local inflammation from insect  bite, no systemic symptoms, patient initially tachycardic, on recheck heart rate improved to low one hundreds of 103.  Do not suspect infection at this time.  Recommending topical triamcinolone along with antihistamines.  Discussed strict return precautions. Patient verbalized understanding and is agreeable with plan.  Final Clinical Impressions(s) / UC Diagnoses   Final diagnoses:  Insect bite of left middle finger, initial encounter  Insect bite of right shoulder, initial encounter     Discharge Instructions     Please use triamcinolone cream twice daily to areas to help with inflammation, swelling and tiching Begin daily allegra May apply ice Follow up if not improving or worsening, developing increased pain, swelling, warmth, drainage, fever, chills, body aches, dizzy, lightheaded   ED Prescriptions    Medication Sig Dispense Auth. Provider   triamcinolone cream (KENALOG) 0.1 % Apply 1 application topically 2 (two) times daily. 45 g Latroya Ng, [place] C, PA-C     PDMP not reviewed this encounter.   Janith Lima, [place] 10/03/19 1910

## 2019-10-03 NOTE — Discharge Instructions (Signed)
Please use triamcinolone cream twice daily to areas to help with inflammation, swelling and tiching Begin daily allegra May apply ice Follow up if not improving or worsening, developing increased pain, swelling, warmth, drainage, fever, chills, body aches, dizzy, lightheaded

## 2019-10-07 ENCOUNTER — Ambulatory Visit (INDEPENDENT_AMBULATORY_CARE_PROVIDER_SITE_OTHER): Payer: Medicare (Managed Care) | Admitting: Registered Nurse

## 2019-10-07 ENCOUNTER — Other Ambulatory Visit: Payer: Self-pay

## 2019-10-07 ENCOUNTER — Encounter: Payer: Self-pay | Admitting: Registered Nurse

## 2019-10-07 VITALS — BP 136/83 | HR 100 | Temp 98.3°F | Resp 17 | Ht 65.0 in | Wt 170.2 lb

## 2019-10-07 DIAGNOSIS — U071 COVID-19: Secondary | ICD-10-CM

## 2019-10-07 NOTE — Patient Instructions (Signed)
° ° ° °  If you have lab work done today you will be contacted with your lab results within the next 2 weeks.  If you have not heard from us then please contact us. The fastest way to get your results is to register for My Chart. ° ° °IF you received an x-ray today, you will receive an invoice from [facility]. Please contact [facility] at [phone] with questions or concerns regarding your invoice.  ° °IF you received labwork today, you will receive an invoice from [facility]. Please contact [facility] at [phone] with questions or concerns regarding your invoice.  ° °Our billing staff will not be able to assist you with questions regarding bills from these companies. ° °You will be contacted with the lab results as soon as they are available. The fastest way to get your results is to activate your My Chart account. Instructions are located on the last page of this paperwork. If you have not heard from us regarding the results in 2 weeks, please contact this office. °  ° ° ° °

## 2019-10-11 ENCOUNTER — Encounter: Payer: Self-pay | Admitting: Registered Nurse

## 2019-10-11 NOTE — Progress Notes (Signed)
Established Patient Office Visit  Subjective:  Patient ID: Susan Berg, female    DOB: 31-Jan-1946  Age: 74 y.o. MRN: [id number]  CC:  Chief Complaint  Patient presents with  . Covid Vaccine     patient states she we went to get the Covid-19 infusion on 5/14 and states that after that she had got dizzy and nausea but after she took Meclizine she was okay.    HPI Susan Berg presents for COVID infusion follow up  No symptoms with covid but given age and comorbid conditions, was sent to infusion center. Handled well, some irritation at infusion site but taken tylenol which provided much relief.  Also notes in the days after, had a few episodes of dizziness. Took meclizine and improved immediately. Overall, feeling well today and is without complaint.   Past Medical History:  Diagnosis Date  . High cholesterol   . Hypertension   . Vertigo   . Vitamin D deficiency     Past Surgical History:  Procedure Laterality Date  . ANKLE SURGERY      Family History  Problem Relation Age of Onset  . Diabetes Mother   . Rheum arthritis Mother   . Stroke Mother   . Cancer Father     Social History   Socioeconomic History  . Marital status: Widowed    Spouse name: Not on file  . Number of children: 2  . Years of education: Not on file  . Highest education level: Not on file  Occupational History  . Not on file  Tobacco Use  . Smoking status: Never Smoker  . Smokeless tobacco: Never Used  Substance and Sexual Activity  . Alcohol use: No  . Drug use: No  . Sexual activity: Not on file  Other Topics Concern  . Not on file  Social History Narrative  . Not on file   Social Determinants of Health   Financial Resource Strain:   . Difficulty of Paying Living Expenses:   Food Insecurity:   . Worried About Charity fundraiser in the Last Year:   . Arboriculturist in the Last Year:   Transportation Needs:   . Film/video editor (Medical):   Marland Kitchen Lack of  Transportation (Non-Medical):   Physical Activity:   . Days of Exercise per Week:   . Minutes of Exercise per Session:   Stress:   . Feeling of Stress :   Social Connections:   . Frequency of Communication with Friends and Family:   . Frequency of Social Gatherings with Friends and Family:   . Attends Religious Services:   . Active Member of Clubs or Organizations:   . Attends Archivist Meetings:   Marland Kitchen Marital Status:   Intimate Partner Violence:   . Fear of Current or Ex-Partner:   . Emotionally Abused:   Marland Kitchen Physically Abused:   . Sexually Abused:     Outpatient Medications Prior to Visit  Medication Sig Dispense Refill  . benzonatate (TESSALON) 100 MG capsule Take 1 capsule (100 mg total) by mouth 2 (two) times daily as needed for cough. 20 capsule 0  . benzonatate (TESSALON) 100 MG capsule Take 1 capsule (100 mg total) by mouth every 8 (eight) hours. 21 capsule 0  . losartan (COZAAR) 25 MG tablet Take 1 tablet (25 mg total) by mouth daily. 90 tablet 0  . meclizine (ANTIVERT) 25 MG tablet Take 1 tablet (25 mg total) by mouth 2 (two) times  daily as needed for dizziness. 60 tablet 1  . simvastatin (ZOCOR) 40 MG tablet Take 1 tablet (40 mg total) by mouth daily. 90 tablet 0  . triamcinolone cream (KENALOG) 0.1 % Apply 1 application topically 2 (two) times daily. 45 g 0  . Vitamin D, Ergocalciferol, (DRISDOL) 1.25 MG (50000 UNIT) CAPS capsule Take 1 capsule (50,000 Units total) by mouth every 7 (seven) days. 6 capsule 0   No facility-administered medications prior to visit.    Allergies  Allergen Reactions  . Codeine Nausea And Vomiting  . Vytorin [Ezetimibe-Simvastatin] Hives  . Lipitor [Atorvastatin] Rash    ROS Review of Systems  Constitutional: Negative.   HENT: Negative.   Eyes: Negative.   Respiratory: Negative.   Cardiovascular: Negative.   Gastrointestinal: Negative.   Endocrine: Negative.   Genitourinary: Negative.   Musculoskeletal: Negative.   Skin:  Negative.   Allergic/Immunologic: Negative.   Neurological: Negative.   Hematological: Negative.   Psychiatric/Behavioral: Negative.   All other systems reviewed and are negative.     Objective:    Physical Exam  Constitutional: She is oriented to person, place, and time. She appears well-developed and well-nourished. No distress.  Cardiovascular: Normal rate, regular rhythm and normal heart sounds. Exam reveals no gallop and no friction rub.  No murmur heard. Pulmonary/Chest: Effort normal and breath sounds normal. No respiratory distress. She has no wheezes. She has no rales. She exhibits no tenderness.  Neurological: She is alert and oriented to person, place, and time. No cranial nerve deficit. Coordination normal.  Skin: Skin is warm and dry. No rash noted. She is not diaphoretic. No erythema. No pallor.  Psychiatric: She has a normal mood and affect. Her behavior is normal. Judgment and thought content normal.  Nursing note and vitals reviewed.   BP 136/83   Pulse 100   Temp 98.3 F (36.8 C) (Temporal)   Resp 17   Ht 5\' 5"  (1.651 m)   Wt 170 lb 3.2 oz (77.2 kg)   SpO2 98%   BMI 28.32 kg/m  Wt Readings from Last 3 Encounters:  10/07/19 170 lb 3.2 oz (77.2 kg)  09/15/19 178 lb (80.7 kg)  09/06/19 179 lb 6.4 oz (81.4 kg)     Health Maintenance Due  Topic Date Due  . MAMMOGRAM  Never done    There are no preventive care reminders to display for this patient.  Lab Results  Component Value Date   TSH 2.970 09/06/2019   Lab Results  Component Value Date   WBC 4.6 09/06/2019   HGB 12.4 09/06/2019   HCT 38.3 09/06/2019   MCV 93 09/06/2019   PLT 273 09/06/2019   Lab Results  Component Value Date   NA 141 09/06/2019   K 4.3 09/06/2019   CO2 23 09/06/2019   GLUCOSE 114 (H) 09/06/2019   BUN 16 09/06/2019   CREATININE 0.95 09/06/2019   BILITOT 0.8 09/06/2019   ALKPHOS 88 09/06/2019   AST 20 09/06/2019   ALT 18 09/06/2019   PROT 7.9 09/06/2019   ALBUMIN  4.5 09/06/2019   CALCIUM 9.7 09/06/2019   ANIONGAP 6 10/31/2015   Lab Results  Component Value Date   CHOL 181 09/06/2019   Lab Results  Component Value Date   HDL 48 09/06/2019   Lab Results  Component Value Date   LDLCALC 106 (H) 09/06/2019   Lab Results  Component Value Date   TRIG 155 (H) 09/06/2019   Lab Results  Component Value Date  CHOLHDL 3.8 09/06/2019   Lab Results  Component Value Date   HGBA1C 5.9 (H) 09/06/2019      Assessment & Plan:   Problem List Items Addressed This Visit    None    Visit Diagnoses    COVID-19    -  Primary      No orders of the defined types were placed in this encounter.   Follow-up: No follow-ups on file.   PLAN  Pt case of COVID has resolved. Feeling well and without complaint  Discussed course of covid, purpose of infusion, what to expect going forward  Encourage vaccination at an appropriate interval from active disease  Patient encouraged to call clinic with any questions, comments, or concerns.  Maximiano Coss, NP

## 2019-11-02 ENCOUNTER — Ambulatory Visit: Payer: Medicare (Managed Care) | Admitting: Audiologist

## 2019-11-08 ENCOUNTER — Encounter (HOSPITAL_COMMUNITY): Payer: Self-pay | Admitting: Emergency Medicine

## 2019-11-08 ENCOUNTER — Other Ambulatory Visit: Payer: Self-pay

## 2019-11-08 ENCOUNTER — Ambulatory Visit (HOSPITAL_COMMUNITY)
Admission: EM | Admit: 2019-11-08 | Discharge: 2019-11-08 | Disposition: A | Discharge status: Home / Self Care | Payer: Medicare (Managed Care) | Attending: Physician Assistant | Admitting: Physician Assistant

## 2019-11-08 DIAGNOSIS — H5789 Other specified disorders of eye and adnexa: Secondary | ICD-10-CM | POA: Diagnosis not present

## 2019-11-08 MED ORDER — TETRACAINE HCL 0.5 % OP SOLN
OPHTHALMIC | Status: AC
  Filled 2019-11-08: qty 4

## 2019-11-08 MED ORDER — HYPROMELLOSE (GONIOSCOPIC) 2.5 % OP SOLN
1.0000 [drp] | Freq: Four times a day (QID) | OPHTHALMIC | 12 refills | Status: DC | PRN
Start: 2019-11-08 — End: 2022-04-02

## 2019-11-08 MED ORDER — FLUORESCEIN SODIUM 1 MG OP STRP
ORAL_STRIP | OPHTHALMIC | Status: AC
  Filled 2019-11-08: qty 1

## 2019-11-08 MED ORDER — HYPROMELLOSE (GONIOSCOPIC) 2.5 % OP SOLN
1.0000 [drp] | Freq: Four times a day (QID) | OPHTHALMIC | 12 refills | Status: DC | PRN
Start: 2019-11-08 — End: 2019-11-08

## 2019-11-08 NOTE — ED Triage Notes (Signed)
Pt states she was used a hari care product called [employer] and some dripped down into the right eye. She states she immediately washed the eye out. She is just concerned because of the chemicals.

## 2019-11-08 NOTE — ED Notes (Signed)
Notified samantha, np of patient having a chemical in eye.  Showed samantha, np the bottle (hair product).  Instructed to call poison control.  Desiree, CMA calling poison control

## 2019-11-08 NOTE — ED Notes (Signed)
Spoke with Kalman Shan, RN at poison control she states that patient should be ok based on product ingredients to just treat symptomatically. She recommends patient has a fluorescein dye placed to check. Provider made aware.

## 2019-11-08 NOTE — ED Provider Notes (Signed)
[place]    CSN: [id number] Arrival date & time: 11/08/19  1256      History   Chief Complaint Chief Complaint  Patient presents with  . Eye Problem    HPI Susan Berg is a 74 y.o. female.   Patient reports urgent care for evaluation of eye irritation.  She reports around 3 AM this morning she was using a hair product when it dripped into her right eye.  She reports this caused little bit of burning.  She flushed this out with lots of water.  She reports since then low level of irritation and eye watering.  Reports vision is fine unless eye is watery.  She does not wear glasses or contacts.  She would like to have information for an eye doctor in the area.  Patient has product with her it is a CBD based hair growth product     Past Medical History:  Diagnosis Date  . High cholesterol   . Hypertension   . Vertigo   . Vitamin D deficiency     Patient Active Problem List   Diagnosis Date Noted  . Tinnitus of both ears 09/06/2019  . Rash and nonspecific skin eruption 09/06/2019    Past Surgical History:  Procedure Laterality Date  . ANKLE SURGERY      OB History   No obstetric history on file.      Home Medications    Prior to Admission medications   Medication Sig Start Date End Date Taking? Authorizing Provider  benzonatate (TESSALON) 100 MG capsule Take 1 capsule (100 mg total) by mouth 2 (two) times daily as needed for cough. 09/16/19   Maximiano Coss, NP  benzonatate (TESSALON) 100 MG capsule Take 1 capsule (100 mg total) by mouth every 8 (eight) hours. 09/21/19   Alroy Bailiff, Margaux, PA-C  hydroxypropyl methylcellulose / hypromellose (ISOPTO TEARS / GONIOVISC) 2.5 % ophthalmic solution Place 1 drop into the right eye 4 (four) times daily as needed for dry eyes. 11/08/19   Kunio Cummiskey, Marguerita Beards, PA-C  losartan (COZAAR) 25 MG tablet Take 1 tablet (25 mg total) by mouth daily. 08/12/19   Raylene Everts, MD  meclizine (ANTIVERT) 25 MG tablet Take 1  tablet (25 mg total) by mouth 2 (two) times daily as needed for dizziness. 08/12/19   Raylene Everts, MD  simvastatin (ZOCOR) 40 MG tablet Take 1 tablet (40 mg total) by mouth daily. 08/12/19   Raylene Everts, MD  triamcinolone cream (KENALOG) 0.1 % Apply 1 application topically 2 (two) times daily. 10/03/19   Wieters, Hallie C, PA-C  Vitamin D, Ergocalciferol, (DRISDOL) 1.25 MG (50000 UNIT) CAPS capsule Take 1 capsule (50,000 Units total) by mouth every 7 (seven) days. 09/08/19   Maximiano Coss, NP  amLODipine (NORVASC) 5 MG tablet Take 2 tablets (10 mg total) by mouth daily. Patient taking differently: Take 5 mg by mouth daily.  06/01/14 10/07/19  Milton Ferguson, MD  fexofenadine (ALLEGRA) 180 MG tablet Take 1 tablet (180 mg total) by mouth daily. Patient taking differently: Take 180 mg by mouth daily as needed for allergies.  08/22/15 04/15/19  Billy Fischer, MD  fluticasone (FLONASE) 50 MCG/ACT nasal spray Place 1 spray into both nostrils 2 (two) times daily. Patient taking differently: Place 1 spray into both nostrils daily as needed for allergies.  08/22/15 09/15/19  Billy Fischer, MD    Family History Family History  Problem Relation Age of Onset  . Diabetes Mother   .  Rheum arthritis Mother   . Stroke Mother   . Cancer Father     Social History Social History   Tobacco Use  . Smoking status: Never Smoker  . Smokeless tobacco: Never Used  Vaping Use  . Vaping Use: Never used  Substance Use Topics  . Alcohol use: No  . Drug use: No     Allergies   Codeine, Vytorin [ezetimibe-simvastatin], and Lipitor [atorvastatin]   Review of Systems Review of Systems   Physical Exam Triage Vital Signs ED Triage Vitals  Enc Vitals Group     BP 11/08/19 1318 (!) 117/59     Pulse Rate 11/08/19 1318 90     Resp 11/08/19 1318 14     Temp 11/08/19 1318 98.9 F (37.2 C)     Temp Source 11/08/19 1318 Oral     SpO2 11/08/19 1318 100 %     Weight --      Height --      Head  Circumference --      Peak Flow --      Pain Score 11/08/19 1319 5     Pain Loc --      Pain Edu? --      Excl. in [place]? --    No data found.  Updated Vital Signs BP (!) 117/59   Pulse 90   Temp 98.9 F (37.2 C) (Oral)   Resp 14   SpO2 100%   Visual Acuity Right Eye Distance: 20/50 Left Eye Distance: 20/40 Bilateral Distance: 20/40  Right Eye Near:   Left Eye Near:    Bilateral Near:     Physical Exam Vitals and nursing note reviewed.  Constitutional:      General: She is not in acute distress.    Appearance: She is well-developed.  HENT:     Head: Normocephalic and atraumatic.  Eyes:     Conjunctiva/sclera: Conjunctivae normal.     Comments: There is no scleral injection or conjunctivitis.  Vision grossly intact.  Extraocular movements intact. No photophobia. Fluorescein without uptake.  Cardiovascular:     Rate and Rhythm: Normal rate.  Pulmonary:     Effort: Pulmonary effort is normal. No respiratory distress.  Musculoskeletal:     Cervical back: Neck supple.  Skin:    General: Skin is warm and dry.  Neurological:     Mental Status: She is alert.      UC Treatments / Results  Labs (all labs ordered are listed, but only abnormal results are displayed) Labs Reviewed - No data to display  EKG   Radiology No results found.  Procedures Procedures (including critical care time)  Medications Ordered in UC Medications - No data to display  Initial Impression / Assessment and Plan / UC Course  I have reviewed the triage vital signs and the nursing notes.  Pertinent labs & imaging results that were available during my care of the patient were reviewed by me and considered in my medical decision making (see chart for details).     #Eye irritation  Patient is a 74 year old presenting with eye irritation after having a chemical irritant contact with the right eye.  Poison control was called by clinical staff and discussed product, they recommended  symptomatic treatment and fluorescein exam, doubt specific treatment guidelines.  Not felt to have any eyesight threatening products in the compound.  Fluorescein and eye exam reassuring.  Acuity equal bilaterally.  We will treat with real tears.  Ophthalmology follow-up recommended as patient does  want establish care.  Return and emergency department precautions were discussed.  Patient verbalized understanding the plan of care. Final Clinical Impressions(s) / UC Diagnoses   Final diagnoses:  Eye irritation     Discharge Instructions     Use the real tears, store in refridgerator, this may be more soothing  Call the eye doctor for follow up and to establish care  If acutely worsening, return or request sooner appointment with eye doctor   ED Prescriptions    Medication Sig Dispense Auth. Provider   hydroxypropyl methylcellulose / hypromellose (ISOPTO TEARS / GONIOVISC) 2.5 % ophthalmic solution  (Status: Discontinued) Place 1 drop into the right eye 4 (four) times daily as needed for dry eyes. 15 mL Aniesha Haughn, Marguerita Beards, PA-C   hydroxypropyl methylcellulose / hypromellose (ISOPTO TEARS / GONIOVISC) 2.5 % ophthalmic solution Place 1 drop into the right eye 4 (four) times daily as needed for dry eyes. 15 mL Ithzel Fedorchak, Marguerita Beards, PA-C     PDMP not reviewed this encounter.   Purnell Shoemaker, PA-C 11/08/19 1444

## 2019-11-08 NOTE — Discharge Instructions (Signed)
Use the real tears, store in refridgerator, this may be more soothing  Call the eye doctor for follow up and to establish care  If acutely worsening, return or request sooner appointment with eye doctor

## 2019-11-09 ENCOUNTER — Ambulatory Visit: Payer: Medicare (Managed Care) | Attending: Registered Nurse | Admitting: Audiologist

## 2019-11-09 DIAGNOSIS — H9313 Tinnitus, bilateral: Secondary | ICD-10-CM | POA: Insufficient documentation

## 2019-11-09 DIAGNOSIS — H903 Sensorineural hearing loss, bilateral: Secondary | ICD-10-CM | POA: Diagnosis present

## 2019-11-09 NOTE — Procedures (Signed)
Outpatient Audiology and [place], [place]  [id number] [phone]  AUDIOLOGICAL  EVALUATION  NAME: Susan Berg     DOB:   05-07-1946      MRN: [id number]                                                                                     DATE: 11/09/2019     REFERENT: Maximiano Coss, NP STATUS: Outpatient DIAGNOSIS: Sensorineural Hearing Loss Bilateral, Tinnitus Bilateral    History: Susan Berg was seen for an audiological evaluation.  Susan Berg is receiving a hearing evaluation due to concerns for tinnitus. Susan Berg denies any difficulty hearing. Her main concern is the loud hissing sound in both ears. It is high pitched and symmetric. This difficulty began gradually and has been present for over a year.  No pain or pressure reported in either ear today, but she has had some pain in her ears last week. Susan Berg has a history of noise exposure from working in a [employer]. Susan Berg has long standing history of vertigo and does very well with Antivert. The vertigo onset after an accident over 20 years ago. When she feels dizzy the tinnitus is worse. No other relevant case history reported.   Evaluation:   Otoscopy showed a clear view of the tympanic membranes, bilaterally  Tympanometry results were consistent with normal function of the middle ear, bilaterally   Audiometric testing was completed using conventional audiometry with insert ER3A transducer. Speech Recognition Thresholds were consistent with pure tone averages. Word Recognition was good at conversation level. Pure tone thresholds show normal sloping to moderately severe sensorineural hearing loss, bilaterally. Results show asymmetry with the left ear 25dB worst 8k Hz only.   Tinnitus pitch and loudness matched in the right ear. Tinnitus matched to a 6k Hz 50dB (5dB SL) Narrow Band Noise tone. This pitch can be reached by notch therapy using a hearing aid.   Results:  The test results were reviewed  with [facility]. She does not feel she has much of a problem hearing. Hearing aids were discussed but they would require consistent use to be helpful, and [name] currently does not have enough of a hearing issue to warrant all day use. Susan Berg is not interested in hearing aids at this time.  Instead masking strategies for the tinnitus were discussed at length. She spends much of the day in silence which makes the tinnitus very pronounced. A white noise machine, MyNoise App, and Bose Sleep Buds were discussed as options. Susan Berg is interested in trying the [facility] Sleep Buds. We downloaded the app to her phone and discussed how to use the buds. She is to wear the sleep buds whenever in quiet to help mask the tinnitus. The sleep buds volume should be just loud enough to cover the tinnitus and only worn when tinnitus is expected to be bothersome and when sleeping.   Referral to ENT Physician necessary if unilateral symptoms arise in the left ear, such as consistent pain or pressure in the left ear or the left ear tinnitus becomes louder than the right. At this time the 25dB difference between the ears at one pitch only  does not warrant a referral.   Recommendations: 1. Annual audiometric evaluation necessary to monitor progress with tinnitus masking and asymmetrical hearing loss at 8k Hz.  1. Referral to ENT Physician necessary if unilateral symptoms arise in the left ear, such as consistent pain or pressure in the left ear or the left ear tinnitus becomes louder than the right. At this time the 25dB difference between the ears at one pitch only does not warrant a referral.  2. Referral to [facility] for an in depth tinnitus management appointment necessary if current recommendations are not beneficial or tinnitus worsens.   Alfonse Alpers  Audiologist, Au.D., CCC-A 11/09/2019  9:55 AM  Cc: Maximiano Coss, NP

## 2019-11-10 ENCOUNTER — Other Ambulatory Visit: Payer: Self-pay | Admitting: Registered Nurse

## 2019-11-10 MED ORDER — LOSARTAN POTASSIUM 25 MG PO TABS: 25.0000 mg | ORAL_TABLET | Freq: Every day | ORAL | 0 refills | Status: DC

## 2019-11-10 NOTE — Telephone Encounter (Signed)
losartan (COZAAR) 25 MG tablet     Patient is requesting refill.     Pharmacy:  [id number] - [place], [place] Phone:  [phone]  Fax:  [phone]

## 2019-11-10 NOTE — Telephone Encounter (Signed)
Requested medication (s) are due for refill today:   Yes  Requested medication (s) are on the active medication list:   Yes  Future visit scheduled:   No   Last ordered: 08/12/2019 #90  0 refills by Dr. Raylene Everts at the [facility].  Returned because not prescribed by Maximiano Coss.   Requested Prescriptions  Pending Prescriptions Disp Refills   losartan (COZAAR) 25 MG tablet 90 tablet 0    Sig: Take 1 tablet (25 mg total) by mouth daily.      Cardiovascular:  Angiotensin Receptor Blockers Passed - 11/10/2019  4:02 PM      Passed - Cr in normal range and within 180 days    Creatinine, Ser  Date Value Ref Range Status  09/06/2019 0.95 0.57 - 1.00 mg/dL Final          Passed - K in normal range and within 180 days    Potassium  Date Value Ref Range Status  09/06/2019 4.3 3.5 - 5.2 mmol/L Final          Passed - Patient is not pregnant      Passed - Last BP in normal range    BP Readings from Last 1 Encounters:  11/08/19 (!) 117/59          Passed - Valid encounter within last 6 months    Recent Outpatient Visits           1 month ago COVID-19   [facility], Delfino Lovett, NP   1 month ago COVID-19   [facility], Delfino Lovett, NP   2 months ago Encounter to establish care   [facility], Delfino Lovett, NP

## 2019-11-11 ENCOUNTER — Ambulatory Visit: Payer: Medicare (Managed Care)

## 2019-11-11 ENCOUNTER — Other Ambulatory Visit: Payer: Self-pay

## 2019-11-11 DIAGNOSIS — Z8639 Personal history of other endocrine, nutritional and metabolic disease: Secondary | ICD-10-CM

## 2019-11-12 LAB — VITAMIN D 25 HYDROXY (VIT D DEFICIENCY, FRACTURES): Vit D, 25-Hydroxy: 32.9 ng/mL (ref 30.0–100.0)

## 2019-11-15 ENCOUNTER — Encounter: Payer: Self-pay | Admitting: Radiology

## 2019-11-15 NOTE — Progress Notes (Signed)
Good morning,  Normal results letter, please! She should continue an OTC supplement of at least 1000 units daily.  Thanks,  Kathrin Ruddy, NP

## 2019-11-18 ENCOUNTER — Other Ambulatory Visit: Payer: Self-pay

## 2019-11-18 ENCOUNTER — Telehealth: Payer: Self-pay | Admitting: Registered Nurse

## 2019-11-18 ENCOUNTER — Other Ambulatory Visit: Payer: Self-pay | Admitting: Registered Nurse

## 2019-11-18 DIAGNOSIS — I10 Essential (primary) hypertension: Secondary | ICD-10-CM

## 2019-11-18 MED ORDER — AMLODIPINE BESYLATE 5 MG PO TABS: 10.0000 mg | ORAL_TABLET | Freq: Every day | ORAL | 0 refills | Status: DC

## 2019-11-30 ENCOUNTER — Other Ambulatory Visit: Payer: Self-pay | Admitting: Registered Nurse

## 2019-11-30 NOTE — Telephone Encounter (Signed)
Pt called in and stated she lost her medication and is needing another Rx sent to her because she Is out of town.  losartan (COZAAR) 25 MG tablet [[id number]]   [facility] [address] [place] [address]  Pt stated she needs this asap. She also states she has already called about this (I did not see an encounter of this in her chart). Please advise.

## 2019-11-30 NOTE — Telephone Encounter (Signed)
I have called pt back and informed her that she will most likely have to pay for the medication out of pocket since we sent in a 90 day supply on 11/10/19. She stated that she will be there for a week.   I have informed her that I can pend [facility] [place] and route it to the provider to see if he is willing to approve it for a 1 time fill in [place].

## 2019-12-01 MED ORDER — LOSARTAN POTASSIUM 25 MG PO TABS: 25.0000 mg | ORAL_TABLET | Freq: Every day | ORAL | 0 refills | Status: DC

## 2019-12-24 ENCOUNTER — Other Ambulatory Visit: Payer: Self-pay | Admitting: Registered Nurse

## 2019-12-26 ENCOUNTER — Other Ambulatory Visit: Payer: Self-pay | Admitting: Registered Nurse

## 2019-12-26 DIAGNOSIS — I10 Essential (primary) hypertension: Secondary | ICD-10-CM

## 2020-01-27 NOTE — Progress Notes (Signed)
Established Patient Office Visit  Subjective:  Patient ID: Susan Berg, female    DOB: [birth year]  Age: 74 y.o. MRN: [id number]  CC:  Chief Complaint  Patient presents with  . Medication Problem    patient states that the Vitamin D 1st pill on the 27th made her cough  after taking it and the same with the 2nd one she took today. Per patient she would like to discuss the referral to the ENT     HPI [name] presents for medication problem  Had been placed on Vitamin D supplement for deficiency. Unfortunately not tolerating pills well - coughing when she attempts to swallow them  Has been able to take 2, but concerned about the rest. No sputum production. No nvd, fevers, chills, fatigue. No other symptoms of note.   Past Medical History:  Diagnosis Date  . High cholesterol   . Hypertension   . Vertigo   . Vitamin D deficiency     Past Surgical History:  Procedure Laterality Date  . ANKLE SURGERY      Family History  Problem Relation Age of Onset  . Diabetes Mother   . Rheum arthritis Mother   . Stroke Mother   . Cancer Father     Social History   Socioeconomic History  . Marital status: Widowed    Spouse name: Not on file  . Number of children: 2  . Years of education: Not on file  . Highest education level: Not on file  Occupational History  . Not on file  Tobacco Use  . Smoking status: Never Smoker  . Smokeless tobacco: Never Used  Vaping Use  . Vaping Use: Never used  Substance and Sexual Activity  . Alcohol use: No  . Drug use: No  . Sexual activity: Not on file  Other Topics Concern  . Not on file  Social History Narrative  . Not on file   Social Determinants of Health   Financial Resource Strain:   . Difficulty of Paying Living Expenses: Not on file  Food Insecurity:   . Worried About Charity fundraiser in the Last Year: Not on file  . Ran Out of Food in the Last Year: Not on file  Transportation Needs:   . Lack of  Transportation (Medical): Not on file  . Lack of Transportation (Non-Medical): Not on file  Physical Activity:   . Days of Exercise per Week: Not on file  . Minutes of Exercise per Session: Not on file  Stress:   . Feeling of Stress : Not on file  Social Connections:   . Frequency of Communication with Friends and Family: Not on file  . Frequency of Social Gatherings with Friends and Family: Not on file  . Attends Religious Services: Not on file  . Active Member of Clubs or Organizations: Not on file  . Attends Archivist Meetings: Not on file  . Marital Status: Not on file  Intimate Partner Violence:   . Fear of Current or Ex-Partner: Not on file  . Emotionally Abused: Not on file  . Physically Abused: Not on file  . Sexually Abused: Not on file    Outpatient Medications Prior to Visit  Medication Sig Dispense Refill  . meclizine (ANTIVERT) 25 MG tablet Take 1 tablet (25 mg total) by mouth 2 (two) times daily as needed for dizziness. 60 tablet 1  . simvastatin (ZOCOR) 40 MG tablet Take 1 tablet (40 mg total) by mouth  daily. 90 tablet 0  . Vitamin D, Ergocalciferol, (DRISDOL) 1.25 MG (50000 UNIT) CAPS capsule Take 1 capsule (50,000 Units total) by mouth every 7 (seven) days. 6 capsule 0  . losartan (COZAAR) 25 MG tablet Take 1 tablet (25 mg total) by mouth daily. 90 tablet 0  . triamcinolone cream (KENALOG) 0.1 % Apply 1 application topically 2 (two) times daily. 30 g 0   No facility-administered medications prior to visit.    Allergies  Allergen Reactions  . Codeine Nausea And Vomiting  . Vytorin [Ezetimibe-Simvastatin] Hives  . Lipitor [Atorvastatin] Rash    ROS Review of Systems  Constitutional: Negative.   HENT: Negative.   Eyes: Negative.   Respiratory: Negative.   Cardiovascular: Negative.   Gastrointestinal: Negative.   Endocrine: Negative.   Genitourinary: Negative.   Musculoskeletal: Negative.   Skin: Negative.   Allergic/Immunologic: Negative.     Neurological: Negative.   Hematological: Negative.   Psychiatric/Behavioral: Negative.       Objective:    Physical Exam Vitals and nursing note reviewed.  Constitutional:      General: She is not in acute distress.    Appearance: Normal appearance. She is normal weight. She is not ill-appearing, toxic-appearing or diaphoretic.  Cardiovascular:     Rate and Rhythm: Normal rate and regular rhythm.     Heart sounds: Normal heart sounds. No murmur heard.  No friction rub. No gallop.   Pulmonary:     Effort: Pulmonary effort is normal. No respiratory distress.     Breath sounds: Normal breath sounds. No stridor. No wheezing, rhonchi or rales.  Chest:     Chest wall: No tenderness.  Skin:    General: Skin is warm and dry.  Neurological:     General: No focal deficit present.     Mental Status: She is alert and oriented to person, place, and time. Mental status is at baseline.  Psychiatric:        Mood and Affect: Mood normal.        Behavior: Behavior normal.        Thought Content: Thought content normal.        Judgment: Judgment normal.     There were no vitals taken for this visit. Wt Readings from Last 3 Encounters:  10/07/19 170 lb 3.2 oz (77.2 kg)  09/15/19 178 lb (80.7 kg)  09/06/19 179 lb 6.4 oz (81.4 kg)     Health Maintenance Due  Topic Date Due  . COVID-19 Vaccine (1) Never done  . MAMMOGRAM  Never done  . INFLUENZA VACCINE  Never done    There are no preventive care reminders to display for this patient.  Lab Results  Component Value Date   TSH 2.970 09/06/2019   Lab Results  Component Value Date   WBC 4.6 09/06/2019   HGB 12.4 09/06/2019   HCT 38.3 09/06/2019   MCV 93 09/06/2019   PLT 273 09/06/2019   Lab Results  Component Value Date   NA 141 09/06/2019   K 4.3 09/06/2019   CO2 23 09/06/2019   GLUCOSE 114 (H) 09/06/2019   BUN 16 09/06/2019   CREATININE 0.95 09/06/2019   BILITOT 0.8 09/06/2019   ALKPHOS 88 09/06/2019   AST 20  09/06/2019   ALT 18 09/06/2019   PROT 7.9 09/06/2019   ALBUMIN 4.5 09/06/2019   CALCIUM 9.7 09/06/2019   ANIONGAP 6 10/31/2015   Lab Results  Component Value Date   CHOL 181 09/06/2019   Lab Results  Component Value Date   HDL 48 09/06/2019   Lab Results  Component Value Date   LDLCALC 106 (H) 09/06/2019   Lab Results  Component Value Date   TRIG 155 (H) 09/06/2019   Lab Results  Component Value Date   CHOLHDL 3.8 09/06/2019   Lab Results  Component Value Date   HGBA1C 5.9 (H) 09/06/2019      Assessment & Plan:   Problem List Items Addressed This Visit    None    Visit Diagnoses    COVID-19    -  Primary   Relevant Medications   benzonatate (TESSALON) 100 MG capsule      Meds ordered this encounter  Medications  . benzonatate (TESSALON) 100 MG capsule    Sig: Take 1 capsule (100 mg total) by mouth 2 (two) times daily as needed for cough.    Dispense:  20 capsule    Refill:  0    Order Specific Question:   Supervising Provider    Answer:   Forrest Moron [id number]    Follow-up: No follow-ups on file.   PLAN  Tessalon pearls for ongoing cough after COVID  Encourage her to take Vit D through any means necessary  Follow up after completing course of supplement  Patient encouraged to call clinic with any questions, comments, or concerns.  Maximiano Coss, NP

## 2020-02-03 ENCOUNTER — Other Ambulatory Visit: Payer: Self-pay | Admitting: Registered Nurse

## 2020-02-03 DIAGNOSIS — I10 Essential (primary) hypertension: Secondary | ICD-10-CM

## 2020-02-08 ENCOUNTER — Other Ambulatory Visit: Payer: Self-pay | Admitting: Registered Nurse

## 2020-02-08 NOTE — Telephone Encounter (Signed)
Requested medication (s) are due for refill today: yes  Requested medication (s) are on the active medication list:yes   Last refill: 08/12/2019  Future visit scheduled: no  Notes to clinic:  review for refill Medication filled by different provider    Requested Prescriptions  Pending Prescriptions Disp Refills   simvastatin (ZOCOR) 40 MG tablet [Pharmacy Med Name: SIMVASTATIN 40 MG TABLET] 90 tablet 0    Sig: TAKE 1 TABLET BY MOUTH EVERY DAY      Cardiovascular:  Antilipid - Statins Failed - 02/08/2020  6:23 PM      Failed - LDL in normal range and within 360 days    LDL Chol Calc (NIH)  Date Value Ref Range Status  09/06/2019 106 (H) 0 - 99 mg/dL Final          Failed - Triglycerides in normal range and within 360 days    Triglycerides  Date Value Ref Range Status  09/06/2019 155 (H) 0 - 149 mg/dL Final          Passed - Total Cholesterol in normal range and within 360 days    Cholesterol, Total  Date Value Ref Range Status  09/06/2019 181 100 - 199 mg/dL Final          Passed - HDL in normal range and within 360 days    HDL  Date Value Ref Range Status  09/06/2019 48 >39 mg/dL Final          Passed - Patient is not pregnant      Passed - Valid encounter within last 12 months    Recent Outpatient Visits           4 months ago COVID-19   [facility], Delfino Lovett, NP   4 months ago COVID-19   [facility], Delfino Lovett, NP   5 months ago Encounter to establish care   [facility], Delfino Lovett, NP

## 2020-02-09 NOTE — Telephone Encounter (Signed)
02/09/2020 - PATIENT REQUESTING A REFILL ON HER SIMVASTATIN (ZOCOR) 40 mg. I HAVE SCHEDULED HER TO HAVE AN OFFICE VISIT WITH RICH MORROW FOR HER MEDICATION REVIEW ON Wednesday 02/15/2020 AT 10:50 am. I DO NOT HAVE TO ROUTE THIS MESSAGE BACK TO THE CLINICAL TEAM BECAUSE FELICIA K. HAS SENT IN A 30 DAY SUPPLY. Calvin

## 2020-02-09 NOTE — Telephone Encounter (Signed)
Please schedule f/u/med refills. 30 day supply has been sent into the pharmacy.

## 2020-02-09 NOTE — Telephone Encounter (Signed)
No further refills without office visit and labs

## 2020-02-13 ENCOUNTER — Other Ambulatory Visit: Payer: Self-pay | Admitting: Registered Nurse

## 2020-02-15 ENCOUNTER — Ambulatory Visit: Payer: Medicare (Managed Care) | Admitting: Registered Nurse

## 2020-02-21 ENCOUNTER — Encounter: Payer: Self-pay | Admitting: Registered Nurse

## 2020-02-21 ENCOUNTER — Other Ambulatory Visit: Payer: Self-pay

## 2020-02-21 ENCOUNTER — Ambulatory Visit (INDEPENDENT_AMBULATORY_CARE_PROVIDER_SITE_OTHER): Payer: PRIVATE HEALTH INSURANCE | Admitting: Registered Nurse

## 2020-02-21 VITALS — BP 147/87 | HR 89 | Temp 98.4°F | Resp 18 | Ht 65.0 in | Wt 170.2 lb

## 2020-02-21 DIAGNOSIS — Z8639 Personal history of other endocrine, nutritional and metabolic disease: Secondary | ICD-10-CM

## 2020-02-21 DIAGNOSIS — R21 Rash and other nonspecific skin eruption: Secondary | ICD-10-CM

## 2020-02-21 DIAGNOSIS — I10 Essential (primary) hypertension: Secondary | ICD-10-CM

## 2020-02-21 MED ORDER — SIMVASTATIN 40 MG PO TABS: 40.0000 mg | ORAL_TABLET | Freq: Every day | ORAL | 0 refills | Status: DC

## 2020-02-21 MED ORDER — TRIAMCINOLONE ACETONIDE 0.1 % EX CREA: 1.0000 "application " | TOPICAL_CREAM | Freq: Two times a day (BID) | CUTANEOUS | 0 refills | Status: DC

## 2020-02-21 NOTE — Patient Instructions (Signed)
° ° ° °  If you have lab work done today you will be contacted with your lab results within the next 2 weeks.  If you have not heard from us then please contact us. The fastest way to get your results is to register for My Chart. ° ° °IF you received an x-ray today, you will receive an invoice from [facility]. Please contact [facility] at [phone] with questions or concerns regarding your invoice.  ° °IF you received labwork today, you will receive an invoice from [facility]. Please contact [facility] at [phone] with questions or concerns regarding your invoice.  ° °Our billing staff will not be able to assist you with questions regarding bills from these companies. ° °You will be contacted with the lab results as soon as they are available. The fastest way to get your results is to activate your My Chart account. Instructions are located on the last page of this paperwork. If you have not heard from us regarding the results in 2 weeks, please contact this office. °  ° ° ° °

## 2020-02-22 LAB — CBC
Hematocrit: 36.2 % (ref 34.0–46.6)
Hemoglobin: 11.9 g/dL (ref 11.1–15.9)
MCH: 30.1 pg (ref 26.6–33.0)
MCHC: 32.9 g/dL (ref 31.5–35.7)
MCV: 92 fL (ref 79–97)
Platelets: 263 10*3/uL (ref 150–450)
RBC: 3.95 x10E6/uL (ref 3.77–5.28)
RDW: 12.1 % (ref 11.7–15.4)
WBC: 4.3 10*3/uL (ref 3.4–10.8)

## 2020-02-22 LAB — LIPID PANEL
Chol/HDL Ratio: 3.9 ratio (ref 0.0–4.4)
Cholesterol, Total: 179 mg/dL (ref 100–199)
HDL: 46 mg/dL (ref >39)
LDL Chol Calc (NIH): 107 mg/dL — ABNORMAL HIGH (ref 0–99)
Triglycerides: 147 mg/dL (ref 0–149)
VLDL Cholesterol Cal: 26 mg/dL (ref 5–40)

## 2020-02-22 LAB — COMPREHENSIVE METABOLIC PANEL
ALT: 21 IU/L (ref 0–32)
AST: 19 IU/L (ref 0–40)
Albumin/Globulin Ratio: 1.6 (ref 1.2–2.2)
Albumin: 4.7 g/dL (ref 3.7–4.7)
Alkaline Phosphatase: 89 IU/L (ref 44–121)
BUN/Creatinine Ratio: 17 (ref 12–28)
BUN: 15 mg/dL (ref 8–27)
Bilirubin Total: 0.7 mg/dL (ref 0.0–1.2)
CO2: 24 mmol/L (ref 20–29)
Calcium: 9.2 mg/dL (ref 8.7–10.3)
Chloride: 104 mmol/L (ref 96–106)
Creatinine, Ser: 0.88 mg/dL (ref 0.57–1.00)
GFR calc Af Amer: 76 mL/min/{1.73_m2} (ref >59)
GFR calc non Af Amer: 66 mL/min/{1.73_m2} (ref >59)
Globulin, Total: 2.9 g/dL (ref 1.5–4.5)
Glucose: 113 mg/dL — ABNORMAL HIGH (ref 65–99)
Potassium: 4.2 mmol/L (ref 3.5–5.2)
Sodium: 141 mmol/L (ref 134–144)
Total Protein: 7.6 g/dL (ref 6.0–8.5)

## 2020-02-22 LAB — HEMOGLOBIN A1C
Est. average glucose Bld gHb Est-mCnc: 128 mg/dL
Hgb A1c MFr Bld: 6.1 % — ABNORMAL HIGH (ref 4.8–5.6)

## 2020-02-23 ENCOUNTER — Telehealth: Payer: Self-pay | Admitting: Registered Nurse

## 2020-02-23 NOTE — Telephone Encounter (Signed)
Please advise patient calls for an antacid prescription or recommendations .

## 2020-02-23 NOTE — Progress Notes (Signed)
Normal results letter please!  Thank you  Kathrin Ruddy, NP

## 2020-02-23 NOTE — Telephone Encounter (Signed)
Patient was seen 01/22/2020 and thought that provider would be calling in an antacid for her stomach pain  And gas under rib cage  cant seem  where that has been sent in to pharmacy   Please

## 2020-02-24 ENCOUNTER — Other Ambulatory Visit: Payer: Self-pay | Admitting: Registered Nurse

## 2020-02-24 DIAGNOSIS — R1013 Epigastric pain: Secondary | ICD-10-CM

## 2020-02-24 MED ORDER — PANTOPRAZOLE SODIUM 40 MG PO TBEC: 40.0000 mg | DELAYED_RELEASE_TABLET | Freq: Every day | ORAL | 3 refills | Status: DC

## 2020-02-24 NOTE — Telephone Encounter (Signed)
I have called pharmacy and rx has been called into pharmacy. The Protonix 40 mg #30 with 3 refills.

## 2020-02-24 NOTE — Telephone Encounter (Signed)
Pt called back again did not cone through / clinical is calling the pharmacy

## 2020-02-24 NOTE — Telephone Encounter (Signed)
Pt called to follow up on this request / let patient know that her request had been sent to pharmacy today

## 2020-02-24 NOTE — Telephone Encounter (Signed)
Sent ? ?Thanks, ? ?Rich

## 2020-03-08 ENCOUNTER — Ambulatory Visit: Payer: Self-pay

## 2020-03-08 ENCOUNTER — Other Ambulatory Visit: Payer: Self-pay

## 2020-03-08 ENCOUNTER — Other Ambulatory Visit: Payer: Self-pay | Admitting: Registered Nurse

## 2020-03-08 ENCOUNTER — Encounter: Payer: Self-pay | Admitting: Family Medicine

## 2020-03-08 ENCOUNTER — Ambulatory Visit (INDEPENDENT_AMBULATORY_CARE_PROVIDER_SITE_OTHER): Payer: Medicare (Managed Care) | Admitting: Family Medicine

## 2020-03-08 ENCOUNTER — Encounter: Payer: Self-pay | Admitting: *Deleted

## 2020-03-08 VITALS — BP 134/82 | HR 104 | Temp 97.8°F | Resp 16 | Ht 65.0 in | Wt 168.2 lb

## 2020-03-08 DIAGNOSIS — R739 Hyperglycemia, unspecified: Secondary | ICD-10-CM | POA: Diagnosis not present

## 2020-03-08 DIAGNOSIS — Z8639 Personal history of other endocrine, nutritional and metabolic disease: Secondary | ICD-10-CM

## 2020-03-08 DIAGNOSIS — R42 Dizziness and giddiness: Secondary | ICD-10-CM | POA: Diagnosis not present

## 2020-03-08 DIAGNOSIS — H93A9 Pulsatile tinnitus, unspecified ear: Secondary | ICD-10-CM | POA: Diagnosis not present

## 2020-03-08 LAB — POCT CBC
Granulocyte percent: 68.8 %G (ref 37–80)
HCT, POC: 37.8 % (ref 29–41)
Hemoglobin: 12.3 g/dL (ref 11–14.6)
Lymph, poc: 1.1 (ref 0.6–3.4)
MCH, POC: 30.1 pg (ref 27–31.2)
MCHC: 32.6 g/dL (ref 31.8–35.4)
MCV: 92.4 fL (ref 76–111)
MID (cbc): 0.2 (ref 0–0.9)
MPV: 6.5 fL (ref 0–99.8)
POC Granulocyte: 2.9 (ref 2–6.9)
POC LYMPH PERCENT: 26.1 %L (ref 10–50)
POC MID %: 5.1 %M (ref 0–12)
Platelet Count, POC: 289 10*3/uL (ref 142–424)
RBC: 4.1 M/uL (ref 4.04–5.48)
RDW, POC: 13 %
WBC: 4.2 10*3/uL — AB (ref 4.6–10.2)

## 2020-03-08 LAB — GLUCOSE, POCT (MANUAL RESULT ENTRY): POC Glucose: 108 mg/dl — AB (ref 70–99)

## 2020-03-08 NOTE — Telephone Encounter (Signed)
This encounter was created in error - please disregard.

## 2020-03-08 NOTE — Patient Instructions (Addendum)
I do not see any sign of dental infection on your exam today, but do recommend follow-up with dentist to see if there may be a cavity or decay that is causing the  discomfort.  If any gum swelling or worsening symptoms be seen right away.  As previous dizziness has improved with meclizine, can certainly try taking that again as it may be due to vertigo.  Make sure you drink plenty of fluids.  In office blood testing was overall reassuring.  Regarding the noise/tinnitus in your left ear, I do recommend meeting with  ear nose and throat doctor.  I will refer you.  If any new or worsening headache, new weakness or other acute worsening of your symptoms be seen in the emergency room.    If dizziness is not improving in next week - return for recheck.    Tinnitus Tinnitus refers to hearing a sound when there is no actual source for that sound. This is often described as ringing in the ears. However, people with this condition may hear a variety of noises, in one ear or in both ears. The sounds of tinnitus can be soft, loud, or somewhere in between. Tinnitus can last for a few seconds or can be constant for days. It may go away without treatment and come back at various times. When tinnitus is constant or happens often, it can lead to other problems, such as trouble sleeping and trouble concentrating. Almost everyone experiences tinnitus at some point. Tinnitus that is long-lasting (chronic) or comes back often (recurs) may require medical attention. What are the causes? The cause of tinnitus is often not known. In some cases, it can result from:  Exposure to loud noises from machinery, music, or other sources.  An object (foreign body) stuck in the ear.  Earwax buildup.  Drinking alcohol or caffeine.  Taking certain medicines.  Age-related hearing loss. It may also be caused by medical conditions such as:  Ear or sinus infections.  High blood pressure.  Heart  diseases.  Anemia.  Allergies.  Meniere's disease.  Thyroid problems.  Tumors.  A weak, bulging blood vessel (aneurysm) near the ear. What are the signs or symptoms? The main symptom of tinnitus is hearing a sound when there is no source for that sound. It may sound like:  Buzzing.  Roaring.  Ringing.  Blowing air.  Hissing.  Whistling.  Sizzling.  Humming.  Running water.  A musical note.  Tapping. Symptoms may affect only one ear (unilateral) or both ears (bilateral). How is this diagnosed? Tinnitus is diagnosed based on your symptoms, your medical history, and a physical exam. Your health care provider may do a thorough hearing test (audiologic exam) if your tinnitus:  Is unilateral.  Causes hearing difficulties.  Lasts 6 months or longer. You may work with a health care provider who specializes in hearing disorders (audiologist). You may be asked questions about your symptoms and how they affect your daily life. You may have other tests done, such as:  CT scan.  MRI.  An imaging test of how blood flows through your blood vessels (angiogram). How is this treated? Treating an underlying medical condition can sometimes make tinnitus go away. If your tinnitus continues, other treatments may include:  Medicines.  Therapy and counseling to help you manage the stress of living with tinnitus.  Sound generators to mask the tinnitus. These include: ? Tabletop sound machines that play relaxing sounds to help you fall asleep. ? Wearable devices that fit  in your ear and play sounds or music. ? Acoustic neural stimulation. This involves using headphones to listen to music that contains an auditory signal. Over time, listening to this signal may change some pathways in your brain and make you less sensitive to tinnitus. This treatment is used for very severe cases when no other treatment is working.  Using hearing aids or cochlear implants if your tinnitus is  related to hearing loss. Hearing aids are worn in the outer ear. Cochlear implants are surgically placed in the inner ear. Follow these instructions at home: Managing symptoms      When possible, avoid being in loud places and being exposed to loud sounds.  Wear hearing protection, such as earplugs, when you are exposed to loud noises.  Use a white noise machine, a humidifier, or other devices to mask the sound of tinnitus.  Practice techniques for reducing stress, such as meditation, yoga, or deep breathing. Work with your health care provider if you need help with managing stress.  Sleep with your head slightly raised. This may reduce the impact of tinnitus. General instructions  Do not use stimulants, such as nicotine, alcohol, or caffeine. Talk with your health care provider about other stimulants to avoid. Stimulants are substances that can make you feel alert and attentive by increasing certain activities in the body (such as heart rate and blood pressure). These substances may make tinnitus worse.  Take over-the-counter and prescription medicines only as told by your health care provider.  Try to get plenty of sleep each night.  Keep all follow-up visits as told by your health care provider. This is important. Contact a health care provider if:  Your tinnitus continues for 3 weeks or longer without stopping.  You develop sudden hearing loss.  Your symptoms get worse or do not get better with home care.  You feel you are not able to manage the stress of living with tinnitus. Get help right away if:  You develop tinnitus after a head injury.  You have tinnitus along with any of the following: ? Dizziness. ? Loss of balance. ? Nausea and vomiting. ? Sudden, severe headache. These symptoms may represent a serious problem that is an emergency. Do not wait to see if the symptoms will go away. Get medical help right away. Call your local emergency services (911 in the  [place] Do not drive yourself to the hospital. Summary  Tinnitus refers to hearing a sound when there is no actual source for that sound. This is often described as ringing in the ears.  Symptoms may affect only one ear (unilateral) or both ears (bilateral).  Use a white noise machine, a humidifier, or other devices to mask the sound of tinnitus.  Do not use stimulants, such as nicotine, alcohol, or caffeine. Talk with your health care provider about other stimulants to avoid. These substances may make tinnitus worse. This information is not intended to replace advice given to you by your health care provider. Make sure you discuss any questions you have with your health care provider. Document Revised: 11/10/2018 Document Reviewed: 02/05/2017 Elsevier Patient Education  [employer].   Dizziness Dizziness is a common problem. It is a feeling of unsteadiness or light-headedness. You may feel like you are about to faint. Dizziness can lead to injury if you stumble or fall. Anyone can become dizzy, but dizziness is more common in older adults. This condition can be caused by a number of things, including medicines, dehydration, or illness.  Follow these instructions at home: Eating and drinking  Drink enough fluid to keep your urine clear or pale yellow. This helps to keep you from becoming dehydrated. Try to drink more clear fluids, such as water.  Do not drink alcohol.  Limit your caffeine intake if told to do so by your health care provider. Check ingredients and nutrition facts to see if a food or beverage contains caffeine.  Limit your salt (sodium) intake if told to do so by your health care provider. Check ingredients and nutrition facts to see if a food or beverage contains sodium. Activity  Avoid making quick movements. ? Rise slowly from chairs and steady yourself until you feel okay. ? In the morning, first sit up on the side of the bed. When you feel okay, stand slowly  while you hold onto something until you know that your balance is fine.  If you need to stand in one place for a long time, move your legs often. Tighten and relax the muscles in your legs while you are standing.  Do not drive or use heavy machinery if you feel dizzy.  Avoid bending down if you feel dizzy. Place items in your home so that they are easy for you to reach without leaning over. Lifestyle  Do not use any products that contain nicotine or tobacco, such as cigarettes and e-cigarettes. If you need help quitting, ask your health care provider.  Try to reduce your stress level by using methods such as yoga or meditation. Talk with your health care provider if you need help to manage your stress. General instructions  Watch your dizziness for any changes.  Take over-the-counter and prescription medicines only as told by your health care provider. Talk with your health care provider if you think that your dizziness is caused by a medicine that you are taking.  Tell a friend or a family member that you are feeling dizzy. If he or she notices any changes in your behavior, have this person call your health care provider.  Keep all follow-up visits as told by your health care provider. This is important. Contact a health care provider if:  Your dizziness does not go away.  Your dizziness or light-headedness gets worse.  You feel nauseous.  You have reduced hearing.  You have new symptoms.  You are unsteady on your feet or you feel like the room is spinning. Get help right away if:  You vomit or have diarrhea and are unable to eat or drink anything.  You have problems talking, walking, swallowing, or using your arms, hands, or legs.  You feel generally weak.  You are not thinking clearly or you have trouble forming sentences. It may take a friend or family member to notice this.  You have chest pain, abdominal pain, shortness of breath, or sweating.  Your vision  changes.  You have any bleeding.  You have a severe headache.  You have neck pain or a stiff neck.  You have a fever. These symptoms may represent a serious problem that is an emergency. Do not wait to see if the symptoms will go away. Get medical help right away. Call your local emergency services (911 in the [place] Do not drive yourself to the hospital. Summary  Dizziness is a feeling of unsteadiness or light-headedness. This condition can be caused by a number of things, including medicines, dehydration, or illness.  Anyone can become dizzy, but dizziness is more common in older adults.  Drink enough  fluid to keep your urine clear or pale yellow. Do not drink alcohol.  Avoid making quick movements if you feel dizzy. Monitor your dizziness for any changes. This information is not intended to replace advice given to you by your health care provider. Make sure you discuss any questions you have with your health care provider. Document Revised: 05/01/2017 Document Reviewed: 05/31/2016 Elsevier Patient Education  [employer].   If you have lab work done today you will be contacted with your lab results within the next 2 weeks.  If you have not heard from [place] then please contact us. The fastest way to get your results is to register for My Chart.   IF you received an x-ray today, you will receive an invoice from [facility]. Please contact [facility] at [phone] with questions or concerns regarding your invoice.   IF you received labwork today, you will receive an invoice from [facility]. Please contact [facility] at [phone] with questions or concerns regarding your invoice.   Our billing staff will not be able to assist you with questions regarding bills from these companies.  You will be contacted with the lab results as soon as they are available. The fastest way to get your results is to activate your My Chart account. Instructions are located on the  last page of this paperwork. If you have not heard from [place] regarding the results in 2 weeks, please contact this office.

## 2020-03-08 NOTE — Telephone Encounter (Signed)
Pt reporting some side effects of vertigo with new Rx please advise action

## 2020-03-08 NOTE — Telephone Encounter (Signed)
°  Pt. Reports she was seen 02/21/20 and "he changed my medicines." Complaining of lightheadedness x 1 week."I think it's my medicine." Warm transfer to Judson Roch in the practice. Answer Assessment - Initial Assessment Questions 1. DESCRIPTION: "Describe your dizziness."     Dizzy 2. LIGHTHEADED: "Do you feel lightheaded?" (e.g., somewhat faint, woozy, weak upon standing)     Yes 3. VERTIGO: "Do you feel like either you or the room is spinning or tilting?" (i.e. vertigo)     No 4. SEVERITY: "How bad is it?"  "Do you feel like you are going to faint?" "Can you stand and walk?"   - MILD: Feels slightly dizzy, but walking normally.   - MODERATE: Feels very unsteady when walking, but not falling; interferes with normal activities (e.g., school, work) .   - SEVERE: Unable to walk without falling, or requires assistance to walk without falling; feels like passing out now.      Mild 5. ONSET:  "When did the dizziness begin?"     1 Week ago 6. AGGRAVATING FACTORS: "Does anything make it worse?" (e.g., standing, change in head position)     No 7. HEART RATE: "Can you tell me your heart rate?" "How many beats in 15 seconds?"  (Note: not all patients can do this)       No 8. CAUSE: "What do you think is causing the dizziness?"     Maybe medicines 9. RECURRENT SYMPTOM: "Have you had dizziness before?" If Yes, ask: "When was the last time?" "What happened that time?"     Yes 10. OTHER SYMPTOMS: "Do you have any other symptoms?" (e.g., fever, chest pain, vomiting, diarrhea, bleeding)       No 11. PREGNANCY: "Is there any chance you are pregnant?" "When was your last menstrual period?"       No  Protocols used: DIZZINESS [facility]

## 2020-03-08 NOTE — Progress Notes (Signed)
Subjective:  Patient ID: Susan Berg, female    DOB: 1945-11-10  Age: 74 y.o. MRN: [id number]  CC:  Chief Complaint  Patient presents with  . Dizziness    pt notes this has been happening over the last week and half to two weeks, pt also notes no correlation whether she is sitting laying or standing it comes on at random times    HPI [name] presents for  Dizziness: Past week.  2 weeks ago - had sore tooth initially, with temporary headache, that resolved, slight headache yesterday with tooth pain, not persistent  Dizziness started after tooth issue. No appt with dentist until 1 month from now - she will be seen by someone sooner.  No fever, no face swelling.  Random times of onset.  Does not necessarily correlate with movement or change in position. Feels lightheaded, no syncope/near syncope.  No chest pain/palpitations.  No focal weakness/slurred speech/facial droop. No dark/black stools, no blood in stool.  Eating and drinking ok - may be less water recently.  No n/v/abd pain.   History of hypertension, treated with amlodipine.  Hyperlipidemia on Zocor. Covid infection in May of this year, treated with monoclonal antibody.   Has been followed by audiology, note from June 30 reviewed with sensorineural hearing loss, tinnitus.  Annual screening recommended.  Not thought to be significant hearing issue to warrant all day hearing aid use.  Hearing aids were deferred. History of vertigo, urgent care treatment December 4.  Meclizine refilled at that time.  Had labs 02/21/20 - normal CBC. Glucose 113. A1c 6.1. Meclizine - taken 2 times over past week - both times helped dizziness. More ringing in the ears this week - better with meclizine. More of a pulsatile tinnitus this week  - notes with lying down.  ENT - on [address].  No change in hearing.   History Patient Active Problem List   Diagnosis Date Noted  . Tinnitus of both ears 09/06/2019  . Rash and  nonspecific skin eruption 09/06/2019   Past Medical History:  Diagnosis Date  . High cholesterol   . Hypertension   . Vertigo   . Vitamin D deficiency    Past Surgical History:  Procedure Laterality Date  . ANKLE SURGERY     Allergies  Allergen Reactions  . Codeine Nausea And Vomiting  . Vytorin [Ezetimibe-Simvastatin] Hives  . Lipitor [Atorvastatin] Rash   Prior to Admission medications   Medication Sig Start Date End Date Taking? Authorizing Provider  amLODipine (NORVASC) 5 MG tablet TAKE 2 TABLETS BY MOUTH EVERY DAY 02/03/20  Yes Maximiano Coss, NP  benzonatate (TESSALON) 100 MG capsule Take 1 capsule (100 mg total) by mouth 2 (two) times daily as needed for cough. 09/16/19  Yes Maximiano Coss, NP  benzonatate (TESSALON) 100 MG capsule Take 1 capsule (100 mg total) by mouth every 8 (eight) hours. 09/21/19  Yes Venter, Margaux, PA-C  hydroxypropyl methylcellulose / hypromellose (ISOPTO TEARS / GONIOVISC) 2.5 % ophthalmic solution Place 1 drop into the right eye 4 (four) times daily as needed for dry eyes. 11/08/19  Yes Darr, Edison Nasuti, PA-C  losartan (COZAAR) 25 MG tablet TAKE 1 TABLET BY MOUTH EVERY DAY 02/13/20  Yes Maximiano Coss, NP  meclizine (ANTIVERT) 25 MG tablet Take 1 tablet (25 mg total) by mouth 2 (two) times daily as needed for dizziness. 08/12/19  Yes Raylene Everts, MD  pantoprazole (PROTONIX) 40 MG tablet Take 1 tablet (40 mg total) by mouth daily.  02/24/20  Yes Maximiano Coss, NP  simvastatin (ZOCOR) 40 MG tablet TAKE 1 TABLET BY MOUTH EVERY DAY 03/08/20  Yes Maximiano Coss, NP  triamcinolone cream (KENALOG) 0.1 % Apply 1 application topically 2 (two) times daily. 02/21/20  Yes Maximiano Coss, NP  Vitamin D, Ergocalciferol, (DRISDOL) 1.25 MG (50000 UNIT) CAPS capsule Take 1 capsule (50,000 Units total) by mouth every 7 (seven) days. 09/08/19  Yes Maximiano Coss, NP  fexofenadine (ALLEGRA) 180 MG tablet Take 1 tablet (180 mg total) by mouth daily. Patient taking  differently: Take 180 mg by mouth daily as needed for allergies.  08/22/15 04/15/19  Billy Fischer, MD  fluticasone (FLONASE) 50 MCG/ACT nasal spray Place 1 spray into both nostrils 2 (two) times daily. Patient taking differently: Place 1 spray into both nostrils daily as needed for allergies.  08/22/15 09/15/19  Billy Fischer, MD   Social History   Socioeconomic History  . Marital status: Widowed    Spouse name: Not on file  . Number of children: 2  . Years of education: Not on file  . Highest education level: Not on file  Occupational History  . Not on file  Tobacco Use  . Smoking status: Never Smoker  . Smokeless tobacco: Never Used  Vaping Use  . Vaping Use: Never used  Substance and Sexual Activity  . Alcohol use: No  . Drug use: No  . Sexual activity: Not on file  Other Topics Concern  . Not on file  Social History Narrative  . Not on file   Social Determinants of Health   Financial Resource Strain:   . Difficulty of Paying Living Expenses: Not on file  Food Insecurity:   . Worried About Charity fundraiser in the Last Year: Not on file  . Ran Out of Food in the Last Year: Not on file  Transportation Needs:   . Lack of Transportation (Medical): Not on file  . Lack of Transportation (Non-Medical): Not on file  Physical Activity:   . Days of Exercise per Week: Not on file  . Minutes of Exercise per Session: Not on file  Stress:   . Feeling of Stress : Not on file  Social Connections:   . Frequency of Communication with Friends and Family: Not on file  . Frequency of Social Gatherings with Friends and Family: Not on file  . Attends Religious Services: Not on file  . Active Member of Clubs or Organizations: Not on file  . Attends Archivist Meetings: Not on file  . Marital Status: Not on file  Intimate Partner Violence:   . Fear of Current or Ex-Partner: Not on file  . Emotionally Abused: Not on file  . Physically Abused: Not on file  . Sexually Abused:  Not on file    Review of Systems Per HPI.   Objective:   Vitals:   03/08/20 1519  BP: 134/82  Pulse: (!) 104  Resp: 16  Temp: 97.8 F (36.6 C)  TempSrc: Temporal  SpO2: 97%  Weight: 168 lb 3.2 oz (76.3 kg)  Height: 5\' 5"  (1.651 m)     Physical Exam Vitals reviewed.  Constitutional:      Appearance: She is well-developed.  HENT:     Head: Normocephalic and atraumatic.     Mouth/Throat:     Comments: Moist oromucosa, locates area of discomfort at the left upper molars.  No surrounding gum erythema or edema, no apparent abscess, no appreciable decay seen on exam.  No facial swelling.  No lymphadenopathy.  No focal tenderness along teeth with percussion from tongue depressor. Eyes:     Conjunctiva/sclera: Conjunctivae normal.     Pupils: Pupils are equal, round, and reactive to light.  Neck:     Vascular: No carotid bruit.  Cardiovascular:     Rate and Rhythm: Normal rate and regular rhythm.     Heart sounds: Normal heart sounds.  Pulmonary:     Effort: Pulmonary effort is normal.     Breath sounds: Normal breath sounds.  Abdominal:     Palpations: Abdomen is soft. There is no pulsatile mass.     Tenderness: There is no abdominal tenderness.  Skin:    General: Skin is warm and dry.  Neurological:     General: No focal deficit present.     Mental Status: She is alert and oriented to person, place, and time.     Sensory: No sensory deficit.     Motor: No weakness.     Coordination: Coordination normal.     Gait: Gait normal.     Comments: Equal grip strength, equal facial movements, no facial droop, negative pronator drift.  Speaking in full sentences without distress.  Nonfocal exam.  Romberg negative.  Psychiatric:        Mood and Affect: Mood normal.        Behavior: Behavior normal.   No nystagmus appreciated on exam.  Results for orders placed or performed in visit on 03/08/20  POCT glucose (manual entry)  Result Value Ref Range   POC Glucose 108 (A) 70 -  99 mg/dl  POCT CBC  Result Value Ref Range   WBC 4.2 (A) 4.6 - 10.2 K/uL   Lymph, poc 1.1 0.6 - 3.4   POC LYMPH PERCENT 26.1 10 - 50 %L   MID (cbc) 0.2 0 - 0.9   POC MID % 5.1 0 - 12 %M   POC Granulocyte 2.9 2 - 6.9   Granulocyte percent 68.8 37 - 80 %G   RBC 4.10 4.04 - 5.48 M/uL   Hemoglobin 12.3 11 - 14.6 g/dL   HCT, POC 37.8 29 - 41 %   MCV 92.4 76 - 111 fL   MCH, POC 30.1 27 - 31.2 pg   MCHC 32.6 31.8 - 35.4 g/dL   RDW, POC 13.0 %   Platelet Count, POC 289 142 - 424 K/uL   MPV 6.5 0 - 99.8 fL   Orthostatic VS for the past 24 hrs (Last 3 readings):  BP- Lying Pulse- Lying BP- Sitting Pulse- Sitting BP- Standing at 0 minutes Pulse- Standing at 0 minutes BP- Standing at 3 minutes Pulse- Standing at 3 minutes  03/08/20 1704 122/78 79 130/82 87 120/80 100 130/82 97   EKG sinus rhythm, rate 77.  Nonspecific T wave in lead III, no appreciable change from Sep 21, 2019.  No apparent acute findings.   Assessment & Plan:  CORDELLA NYQUIST is a 74 y.o. female . Dizziness - Plan: POCT CBC, Orthostatic vital signs, EKG 12-Lead  Vertigo - Plan: POCT CBC, Orthostatic vital signs, EKG 12-Lead  Pulsatile tinnitus - Plan: Ambulatory referral to ENT  Hyperglycemia - Plan: POCT glucose (manual entry)  Intermittent dizziness, past week to week and a half by history as above.  Noted after her dental pain, not at same time.  Nonfocal neurologic exam.  No apparent periodontal abscess, and possible dental caries.  Has noted improvement when she has taken meclizine, possible peripheral vertigo.  Reassuring CBC, glucose in office, orthostatics, and no apparent changes or acute findings on EKG.  - restart meclizine, few times per day if needed.  - increase fluid intake.   - refer to ENT for new intermittent pulsatile tinnitus.   - follow up with dentist.   -ER/RTC precautions.    No orders of the defined types were placed in this encounter.  Patient Instructions   I do not see any sign of  dental infection on your exam today, but do recommend follow-up with dentist to see if there may be a cavity or decay that is causing the  discomfort.  If any gum swelling or worsening symptoms be seen right away.  As previous dizziness has improved with meclizine, can certainly try taking that again as it may be due to vertigo.  Make sure you drink plenty of fluids.  In office blood testing was overall reassuring.  Regarding the noise/tinnitus in your left ear, I do recommend meeting with  ear nose and throat doctor.  I will refer you.  If any new or worsening headache, new weakness or other acute worsening of your symptoms be seen in the emergency room.    If dizziness is not improving in next week - return for recheck.    Tinnitus Tinnitus refers to hearing a sound when there is no actual source for that sound. This is often described as ringing in the ears. However, people with this condition may hear a variety of noises, in one ear or in both ears. The sounds of tinnitus can be soft, loud, or somewhere in between. Tinnitus can last for a few seconds or can be constant for days. It may go away without treatment and come back at various times. When tinnitus is constant or happens often, it can lead to other problems, such as trouble sleeping and trouble concentrating. Almost everyone experiences tinnitus at some point. Tinnitus that is long-lasting (chronic) or comes back often (recurs) may require medical attention. What are the causes? The cause of tinnitus is often not known. In some cases, it can result from:  Exposure to loud noises from machinery, music, or other sources.  An object (foreign body) stuck in the ear.  Earwax buildup.  Drinking alcohol or caffeine.  Taking certain medicines.  Age-related hearing loss. It may also be caused by medical conditions such as:  Ear or sinus infections.  High blood pressure.  Heart diseases.  Anemia.  Allergies.  Meniere's  disease.  Thyroid problems.  Tumors.  A weak, bulging blood vessel (aneurysm) near the ear. What are the signs or symptoms? The main symptom of tinnitus is hearing a sound when there is no source for that sound. It may sound like:  Buzzing.  Roaring.  Ringing.  Blowing air.  Hissing.  Whistling.  Sizzling.  Humming.  Running water.  A musical note.  Tapping. Symptoms may affect only one ear (unilateral) or both ears (bilateral). How is this diagnosed? Tinnitus is diagnosed based on your symptoms, your medical history, and a physical exam. Your health care provider may do a thorough hearing test (audiologic exam) if your tinnitus:  Is unilateral.  Causes hearing difficulties.  Lasts 6 months or longer. You may work with a health care provider who specializes in hearing disorders (audiologist). You may be asked questions about your symptoms and how they affect your daily life. You may have other tests done, such as:  CT scan.  MRI.  An imaging test of how blood  flows through your blood vessels (angiogram). How is this treated? Treating an underlying medical condition can sometimes make tinnitus go away. If your tinnitus continues, other treatments may include:  Medicines.  Therapy and counseling to help you manage the stress of living with tinnitus.  Sound generators to mask the tinnitus. These include: ? Tabletop sound machines that play relaxing sounds to help you fall asleep. ? Wearable devices that fit in your ear and play sounds or music. ? Acoustic neural stimulation. This involves using headphones to listen to music that contains an auditory signal. Over time, listening to this signal may change some pathways in your brain and make you less sensitive to tinnitus. This treatment is used for very severe cases when no other treatment is working.  Using hearing aids or cochlear implants if your tinnitus is related to hearing loss. Hearing aids are worn in  the outer ear. Cochlear implants are surgically placed in the inner ear. Follow these instructions at home: Managing symptoms      When possible, avoid being in loud places and being exposed to loud sounds.  Wear hearing protection, such as earplugs, when you are exposed to loud noises.  Use a white noise machine, a humidifier, or other devices to mask the sound of tinnitus.  Practice techniques for reducing stress, such as meditation, yoga, or deep breathing. Work with your health care provider if you need help with managing stress.  Sleep with your head slightly raised. This may reduce the impact of tinnitus. General instructions  Do not use stimulants, such as nicotine, alcohol, or caffeine. Talk with your health care provider about other stimulants to avoid. Stimulants are substances that can make you feel alert and attentive by increasing certain activities in the body (such as heart rate and blood pressure). These substances may make tinnitus worse.  Take over-the-counter and prescription medicines only as told by your health care provider.  Try to get plenty of sleep each night.  Keep all follow-up visits as told by your health care provider. This is important. Contact a health care provider if:  Your tinnitus continues for 3 weeks or longer without stopping.  You develop sudden hearing loss.  Your symptoms get worse or do not get better with home care.  You feel you are not able to manage the stress of living with tinnitus. Get help right away if:  You develop tinnitus after a head injury.  You have tinnitus along with any of the following: ? Dizziness. ? Loss of balance. ? Nausea and vomiting. ? Sudden, severe headache. These symptoms may represent a serious problem that is an emergency. Do not wait to see if the symptoms will go away. Get medical help right away. Call your local emergency services (911 in the [place] Do not drive yourself to the  hospital. Summary  Tinnitus refers to hearing a sound when there is no actual source for that sound. This is often described as ringing in the ears.  Symptoms may affect only one ear (unilateral) or both ears (bilateral).  Use a white noise machine, a humidifier, or other devices to mask the sound of tinnitus.  Do not use stimulants, such as nicotine, alcohol, or caffeine. Talk with your health care provider about other stimulants to avoid. These substances may make tinnitus worse. This information is not intended to replace advice given to you by your health care provider. Make sure you discuss any questions you have with your health care provider. Document Revised: 11/10/2018  Document Reviewed: 02/05/2017 Elsevier Patient Education  [employer].   Dizziness Dizziness is a common problem. It is a feeling of unsteadiness or light-headedness. You may feel like you are about to faint. Dizziness can lead to injury if you stumble or fall. Anyone can become dizzy, but dizziness is more common in older adults. This condition can be caused by a number of things, including medicines, dehydration, or illness. Follow these instructions at home: Eating and drinking  Drink enough fluid to keep your urine clear or pale yellow. This helps to keep you from becoming dehydrated. Try to drink more clear fluids, such as water.  Do not drink alcohol.  Limit your caffeine intake if told to do so by your health care provider. Check ingredients and nutrition facts to see if a food or beverage contains caffeine.  Limit your salt (sodium) intake if told to do so by your health care provider. Check ingredients and nutrition facts to see if a food or beverage contains sodium. Activity  Avoid making quick movements. ? Rise slowly from chairs and steady yourself until you feel okay. ? In the morning, first sit up on the side of the bed. When you feel okay, stand slowly while you hold onto something until  you know that your balance is fine.  If you need to stand in one place for a long time, move your legs often. Tighten and relax the muscles in your legs while you are standing.  Do not drive or use heavy machinery if you feel dizzy.  Avoid bending down if you feel dizzy. Place items in your home so that they are easy for you to reach without leaning over. Lifestyle  Do not use any products that contain nicotine or tobacco, such as cigarettes and e-cigarettes. If you need help quitting, ask your health care provider.  Try to reduce your stress level by using methods such as yoga or meditation. Talk with your health care provider if you need help to manage your stress. General instructions  Watch your dizziness for any changes.  Take over-the-counter and prescription medicines only as told by your health care provider. Talk with your health care provider if you think that your dizziness is caused by a medicine that you are taking.  Tell a friend or a family member that you are feeling dizzy. If he or she notices any changes in your behavior, have this person call your health care provider.  Keep all follow-up visits as told by your health care provider. This is important. Contact a health care provider if:  Your dizziness does not go away.  Your dizziness or light-headedness gets worse.  You feel nauseous.  You have reduced hearing.  You have new symptoms.  You are unsteady on your feet or you feel like the room is spinning. Get help right away if:  You vomit or have diarrhea and are unable to eat or drink anything.  You have problems talking, walking, swallowing, or using your arms, hands, or legs.  You feel generally weak.  You are not thinking clearly or you have trouble forming sentences. It may take a friend or family member to notice this.  You have chest pain, abdominal pain, shortness of breath, or sweating.  Your vision changes.  You have any bleeding.  You  have a severe headache.  You have neck pain or a stiff neck.  You have a fever. These symptoms may represent a serious problem that is an emergency. Do  not wait to see if the symptoms will go away. Get medical help right away. Call your local emergency services (911 in the [place] Do not drive yourself to the hospital. Summary  Dizziness is a feeling of unsteadiness or light-headedness. This condition can be caused by a number of things, including medicines, dehydration, or illness.  Anyone can become dizzy, but dizziness is more common in older adults.  Drink enough fluid to keep your urine clear or pale yellow. Do not drink alcohol.  Avoid making quick movements if you feel dizzy. Monitor your dizziness for any changes. This information is not intended to replace advice given to you by your health care provider. Make sure you discuss any questions you have with your health care provider. Document Revised: 05/01/2017 Document Reviewed: 05/31/2016 Elsevier Patient Education  [employer].   If you have lab work done today you will be contacted with your lab results within the next 2 weeks.  If you have not heard from [place] then please contact us. The fastest way to get your results is to register for My Chart.   IF you received an x-ray today, you will receive an invoice from [facility]. Please contact [facility] at [phone] with questions or concerns regarding your invoice.   IF you received labwork today, you will receive an invoice from [facility]. Please contact [facility] at [phone] with questions or concerns regarding your invoice.   Our billing staff will not be able to assist you with questions regarding bills from these companies.  You will be contacted with the lab results as soon as they are available. The fastest way to get your results is to activate your My Chart account. Instructions are located on the last page of this paperwork. If you have not  heard from [place] regarding the results in 2 weeks, please contact this office.         Signed, Merri Ray, MD [facility]

## 2020-03-23 ENCOUNTER — Other Ambulatory Visit: Payer: Self-pay | Admitting: Registered Nurse

## 2020-03-23 DIAGNOSIS — I10 Essential (primary) hypertension: Secondary | ICD-10-CM

## 2020-03-23 NOTE — Telephone Encounter (Signed)
Patient is taking 2 tablets/day. Up to date with appointments- 90 day supply given

## 2020-04-09 ENCOUNTER — Other Ambulatory Visit: Payer: Self-pay | Admitting: Registered Nurse

## 2020-04-09 DIAGNOSIS — Z8639 Personal history of other endocrine, nutritional and metabolic disease: Secondary | ICD-10-CM

## 2020-04-13 ENCOUNTER — Encounter: Payer: Self-pay | Admitting: Registered Nurse

## 2020-04-13 ENCOUNTER — Other Ambulatory Visit: Payer: Self-pay

## 2020-04-13 ENCOUNTER — Ambulatory Visit (INDEPENDENT_AMBULATORY_CARE_PROVIDER_SITE_OTHER): Payer: Medicare (Managed Care) | Admitting: Registered Nurse

## 2020-04-13 ENCOUNTER — Telehealth: Payer: Self-pay | Admitting: Registered Nurse

## 2020-04-13 VITALS — BP 144/82 | HR 100 | Temp 98.0°F | Resp 18 | Ht 65.0 in | Wt 171.8 lb

## 2020-04-13 DIAGNOSIS — S91201A Unspecified open wound of right great toe with damage to nail, initial encounter: Secondary | ICD-10-CM

## 2020-04-13 DIAGNOSIS — B351 Tinea unguium: Secondary | ICD-10-CM | POA: Diagnosis not present

## 2020-04-13 MED ORDER — TERBINAFINE HCL 250 MG PO TABS: 250.0000 mg | ORAL_TABLET | Freq: Every day | ORAL | 0 refills | Status: DC

## 2020-04-13 MED ORDER — TERBINAFINE HCL 1 % EX CREA: 1.0000 "application " | TOPICAL_CREAM | Freq: Two times a day (BID) | CUTANEOUS | 3 refills | Status: DC

## 2020-04-13 NOTE — Telephone Encounter (Signed)
Pt was wondering about ENT referral that Dr. Carlota Raspberry put in back in October. Looks like it was canceled do to the office not excepting pts insurance. Pt is wanting another referral for this that takes her insurance. Pt would like a call when the new referral is put in. Please advise.

## 2020-04-13 NOTE — Progress Notes (Signed)
Established Patient Office Visit  Subjective:  Patient ID: Susan Berg, female    DOB: 1946/01/12  Age: 74 y.o. MRN: [id number]  CC:  Chief Complaint  Patient presents with  . Nail Problem    Patient states her big toenail on her right foot is almost off its just holding on by skin. patient states she is starting to feel pain now that she has a shoe on. She states it has not been bleeding.    HPI Susan Berg presents for toe nail   Reports that she had trauma to nail around 1 year ago, has not been the same since. It has been thickening and now has nearly fallen off. Small bridge of skin holding it on. Not painful but irritating. Looks like there are some loose pieces underneath. Yellow discoloration. Other toenails seem to be thick and potentially some discoloration as well  No systemic symptoms  Overall feeling well   Past Medical History:  Diagnosis Date  . High cholesterol   . Hypertension   . Vertigo   . Vitamin D deficiency     Past Surgical History:  Procedure Laterality Date  . ANKLE SURGERY      Family History  Problem Relation Age of Onset  . Diabetes Mother   . Rheum arthritis Mother   . Stroke Mother   . Cancer Father     Social History   Socioeconomic History  . Marital status: Widowed    Spouse name: Not on file  . Number of children: 2  . Years of education: Not on file  . Highest education level: Not on file  Occupational History  . Not on file  Tobacco Use  . Smoking status: Never Smoker  . Smokeless tobacco: Never Used  Vaping Use  . Vaping Use: Never used  Substance and Sexual Activity  . Alcohol use: No  . Drug use: No  . Sexual activity: Not on file  Other Topics Concern  . Not on file  Social History Narrative  . Not on file   Social Determinants of Health   Financial Resource Strain:   . Difficulty of Paying Living Expenses: Not on file  Food Insecurity:   . Worried About Charity fundraiser in the Last Year: Not  on file  . Ran Out of Food in the Last Year: Not on file  Transportation Needs:   . Lack of Transportation (Medical): Not on file  . Lack of Transportation (Non-Medical): Not on file  Physical Activity:   . Days of Exercise per Week: Not on file  . Minutes of Exercise per Session: Not on file  Stress:   . Feeling of Stress : Not on file  Social Connections:   . Frequency of Communication with Friends and Family: Not on file  . Frequency of Social Gatherings with Friends and Family: Not on file  . Attends Religious Services: Not on file  . Active Member of Clubs or Organizations: Not on file  . Attends Archivist Meetings: Not on file  . Marital Status: Not on file  Intimate Partner Violence:   . Fear of Current or Ex-Partner: Not on file  . Emotionally Abused: Not on file  . Physically Abused: Not on file  . Sexually Abused: Not on file    Outpatient Medications Prior to Visit  Medication Sig Dispense Refill  . amLODipine (NORVASC) 5 MG tablet TAKE 2 TABLETS BY MOUTH EVERY DAY 180 tablet 0  . benzonatate (  TESSALON) 100 MG capsule Take 1 capsule (100 mg total) by mouth 2 (two) times daily as needed for cough. 20 capsule 0  . benzonatate (TESSALON) 100 MG capsule Take 1 capsule (100 mg total) by mouth every 8 (eight) hours. 21 capsule 0  . hydroxypropyl methylcellulose / hypromellose (ISOPTO TEARS / GONIOVISC) 2.5 % ophthalmic solution Place 1 drop into the right eye 4 (four) times daily as needed for dry eyes. 15 mL 12  . losartan (COZAAR) 25 MG tablet TAKE 1 TABLET BY MOUTH EVERY DAY 90 tablet 1  . meclizine (ANTIVERT) 25 MG tablet Take 1 tablet (25 mg total) by mouth 2 (two) times daily as needed for dizziness. 60 tablet 1  . pantoprazole (PROTONIX) 40 MG tablet Take 1 tablet (40 mg total) by mouth daily. 30 tablet 3  . simvastatin (ZOCOR) 40 MG tablet TAKE 1 TABLET BY MOUTH EVERY DAY 30 tablet 0  . triamcinolone cream (KENALOG) 0.1 % Apply 1 application topically 2 (two)  times daily. 45 g 0  . Vitamin D, Ergocalciferol, (DRISDOL) 1.25 MG (50000 UNIT) CAPS capsule Take 1 capsule (50,000 Units total) by mouth every 7 (seven) days. 6 capsule 0   No facility-administered medications prior to visit.    Allergies  Allergen Reactions  . Codeine Nausea And Vomiting  . Vytorin [Ezetimibe-Simvastatin] Hives  . Lipitor [Atorvastatin] Rash    ROS Review of Systems  per hpi     Objective:    Physical Exam Vitals and nursing note reviewed.  Constitutional:      General: She is not in acute distress.    Appearance: Normal appearance. She is normal weight. She is not ill-appearing, toxic-appearing or diaphoretic.  Cardiovascular:     Rate and Rhythm: Normal rate and regular rhythm.     Heart sounds: Normal heart sounds. No murmur heard.  No friction rub. No gallop.   Pulmonary:     Effort: Pulmonary effort is normal. No respiratory distress.     Breath sounds: Normal breath sounds. No stridor. No wheezing, rhonchi or rales.  Chest:     Chest wall: No tenderness.  Musculoskeletal:     Right lower leg: No edema.     Left lower leg: No edema.  Skin:    General: Skin is warm and dry.     Capillary Refill: Capillary refill takes less than 2 seconds.     Coloration: Skin is not jaundiced or pale.     Findings: No bruising, erythema, lesion or rash.     Nails: There is no clubbing.     Comments: Toe nails show severe onychomycosis. Great toe R foot nearly detached, hanging by tissue at lateral nail bed.   Neurological:     General: No focal deficit present.     Mental Status: She is alert and oriented to person, place, and time. Mental status is at baseline.  Psychiatric:        Mood and Affect: Mood normal.        Behavior: Behavior normal.        Thought Content: Thought content normal.        Judgment: Judgment normal.     BP (!) 144/82   Pulse 100   Temp 98 F (36.7 C) (Temporal)   Resp 18   Ht 5\' 5"  (1.651 m)   Wt 171 lb 12.8 oz (77.9 kg)    SpO2 98%   BMI 28.59 kg/m  Wt Readings from Last 3 Encounters:  04/13/20 171 lb  12.8 oz (77.9 kg)  03/08/20 168 lb 3.2 oz (76.3 kg)  02/21/20 170 lb 3.2 oz (77.2 kg)     Health Maintenance Due  Topic Date Due  . MAMMOGRAM  Never done    There are no preventive care reminders to display for this patient.  Lab Results  Component Value Date   TSH 2.970 09/06/2019   Lab Results  Component Value Date   WBC 4.2 (A) 03/08/2020   HGB 12.3 03/08/2020   HCT 37.8 03/08/2020   MCV 92.4 03/08/2020   PLT 263 02/21/2020   Lab Results  Component Value Date   NA 141 02/21/2020   K 4.2 02/21/2020   CO2 24 02/21/2020   GLUCOSE 113 (H) 02/21/2020   BUN 15 02/21/2020   CREATININE 0.88 02/21/2020   BILITOT 0.7 02/21/2020   ALKPHOS 89 02/21/2020   AST 19 02/21/2020   ALT 21 02/21/2020   PROT 7.6 02/21/2020   ALBUMIN 4.7 02/21/2020   CALCIUM 9.2 02/21/2020   ANIONGAP 6 10/31/2015   Lab Results  Component Value Date   CHOL 179 02/21/2020   Lab Results  Component Value Date   HDL 46 02/21/2020   Lab Results  Component Value Date   LDLCALC 107 (H) 02/21/2020   Lab Results  Component Value Date   TRIG 147 02/21/2020   Lab Results  Component Value Date   CHOLHDL 3.9 02/21/2020   Lab Results  Component Value Date   HGBA1C 6.1 (H) 02/21/2020      Assessment & Plan:   Problem List Items Addressed This Visit    None    Visit Diagnoses    Onychomycosis    -  Primary   Relevant Medications   terbinafine (LAMISIL) 250 MG tablet   terbinafine (LAMISIL AT) 1 % cream   Traumatic loss of toenail of right great toe, initial encounter          Meds ordered this encounter  Medications  . terbinafine (LAMISIL) 250 MG tablet    Sig: Take 1 tablet (250 mg total) by mouth daily.    Dispense:  84 tablet    Refill:  0    Order Specific Question:   Supervising Provider    Answer:   Carlota Raspberry, JEFFREY R [[id number]]  . terbinafine (LAMISIL AT) 1 % cream    Sig: Apply 1  application topically 2 (two) times daily.    Dispense:  42 g    Refill:  3    Order Specific Question:   Supervising Provider    Answer:   Carlota Raspberry, JEFFREY R [[id number]]    Follow-up: No follow-ups on file.   PLAN  Removed toe nail and debrided nail bed  Will give topical lamisil and also oral terbinafine for severity of onychomycosis  Discussed nonpharm  Will send to podiatry if no improvement in 12 weeks  Patient encouraged to call clinic with any questions, comments, or concerns.  Maximiano Coss, NP

## 2020-04-13 NOTE — Telephone Encounter (Signed)
Susan Berg Can we reopen this referral and schedule with a place that excepts patient ins or would you need a new referral

## 2020-04-13 NOTE — Patient Instructions (Addendum)
[name] to see you today. Glad we were able to get the nail almost entirely off.  What is happening for you is a combination of past trauma to the nail and onychomycosis - the medical term for a fungal infection of the nail.  Topical lamisil is generally first line - apply once daily. I will also start you on terbinafine 250mg  to take once a day for the next 12 weeks (84 days) Nonpharmacological treatment can include soaking the nails in warm water, keeping them clean and dry, and monitor nail health.  If there is no resolution of this within the next 12 weeks, let me know. I won't make you come in for another appointment, but I would refer you to a podiatrist (foot specialist) for assessment and management.  Thank you, Susan Berg,  Susan Berg    If you have lab work done today you will be contacted with your lab results within the next 2 weeks.  If you have not heard from [place] then please contact us. The fastest way to get your results is to register for My Chart.   IF you received an x-ray today, you will receive an invoice from [facility]. Please contact [facility] at [phone] with questions or concerns regarding your invoice.   IF you received labwork today, you will receive an invoice from [facility]. Please contact [facility] at [phone] with questions or concerns regarding your invoice.   Our billing staff will not be able to assist you with questions regarding bills from these companies.  You will be contacted with the lab results as soon as they are available. The fastest way to get your results is to activate your My Chart account. Instructions are located on the last page of this paperwork. If you have not heard from [place] regarding the results in 2 weeks, please contact this office.      Fungal Nail Infection A fungal nail infection is a common infection of the toenails or fingernails. This condition affects toenails more often than  fingernails. It often affects the great, or big, toes. More than one nail may be infected. The condition can be passed from person to person (is contagious). What are the causes? This condition is caused by a fungus. Several types of fungi can cause the infection. These fungi are common in moist and warm areas. If your hands or feet come into contact with the fungus, it may get into a crack in your fingernail or toenail and cause the infection. What increases the risk? The following factors may make you more likely to develop this condition:  Being female.  Being of older age.  Living with someone who has the fungus.  Walking barefoot in areas where the fungus thrives, such as showers or locker rooms.  Wearing shoes and socks that cause your feet to sweat.  Having a nail injury or a recent nail surgery.  Having certain medical conditions, such as: ? Athlete's foot. ? Diabetes. ? Psoriasis. ? Poor circulation. ? A weak body defense system (immune system). What are the signs or symptoms? Symptoms of this condition include:  A pale spot on the nail.  Thickening of the nail.  A nail that becomes yellow or brown.  A brittle or ragged nail edge.  A crumbling nail.  A nail that has lifted away from the nail bed. How is this diagnosed? This condition is diagnosed with a physical exam. Your health care provider may take a scraping or  clipping from your nail to test for the fungus. How is this treated? Treatment is not needed for mild infections. If you have significant nail changes, treatment may include:  Antifungal medicines taken by mouth (orally). You may need to take the medicine for several weeks or several months, and you may not see the results for a long time. These medicines can cause side effects. Ask your health care provider what problems to watch for.  Antifungal nail polish or nail cream. These may be used along with oral antifungal medicines.  Laser treatment of  the nail.  Surgery to remove the nail. This may be needed for the most severe infections. It can take a long time, usually up to a year, for the infection to go away. The infection may also come back. Follow these instructions at home: Medicines  Take or apply over-the-counter and prescription medicines only as told by your health care provider.  Ask your health care provider about using over-the-counter mentholated ointment on your nails. Nail care  Trim your nails often.  Wash and dry your hands and feet every day.  Keep your feet dry: ? Wear absorbent socks, and change your socks frequently. ? Wear shoes that allow air to circulate, such as sandals or canvas tennis shoes. Throw out old shoes.  Do not use artificial nails.  If you go to a nail salon, make sure you choose one that uses clean instruments.  Use antifungal foot powder on your feet and in your shoes. General instructions  Do not share personal items, such as towels or nail clippers.  Do not walk barefoot in shower rooms or locker rooms.  Wear rubber gloves if you are working with your hands in wet areas.  Keep all follow-up visits as told by your health care provider. This is important. Contact a health care provider if: Your infection is not getting better or it is getting worse after several months. Summary  A fungal nail infection is a common infection of the toenails or fingernails.  Treatment is not needed for mild infections. If you have significant nail changes, treatment may include taking medicine orally and applying medicine to your nails.  It can take a long time, usually up to a year, for the infection to go away. The infection may also come back.  Take or apply over-the-counter and prescription medicines only as told by your health care provider.  Follow instructions for taking care of your nails to help prevent infection from coming back or spreading. This information is not intended to  replace advice given to you by your health care provider. Make sure you discuss any questions you have with your health care provider. Document Revised: 08/19/2018 Document Reviewed: 10/02/2017 Elsevier Patient Education  [place].

## 2020-04-22 ENCOUNTER — Encounter: Payer: Self-pay | Admitting: Registered Nurse

## 2020-04-22 NOTE — Progress Notes (Signed)
Established Patient Office Visit  Subjective:  Patient ID: Susan Berg, female    DOB: 11-14-1945  Age: 74 y.o. MRN: [id number]  CC:  Chief Complaint  Patient presents with  . Medication Refill    PAtient is here for medication refill for 90 day supply of Simvastatin. Also to discuss a rash from the covid Vaccine she got on 02/12/2020 on left arm.    HPI Susan Berg presents for medication refill  Hld: on simvastatin, tolerates well, no concerns. Hopes to continue  covid rash: after vaccine. Left arm. Local redness, warmth, tenderness. Continues to improve. No AEs.   No further concerns. Feeling well overall.   Past Medical History:  Diagnosis Date  . High cholesterol   . Hypertension   . Vertigo   . Vitamin D deficiency     Past Surgical History:  Procedure Laterality Date  . ANKLE SURGERY      Family History  Problem Relation Age of Onset  . Diabetes Mother   . Rheum arthritis Mother   . Stroke Mother   . Cancer Father     Social History   Socioeconomic History  . Marital status: Widowed    Spouse name: Not on file  . Number of children: 2  . Years of education: Not on file  . Highest education level: Not on file  Occupational History  . Not on file  Tobacco Use  . Smoking status: Never Smoker  . Smokeless tobacco: Never Used  Vaping Use  . Vaping Use: Never used  Substance and Sexual Activity  . Alcohol use: No  . Drug use: No  . Sexual activity: Not on file  Other Topics Concern  . Not on file  Social History Narrative  . Not on file   Social Determinants of Health   Financial Resource Strain: Not on file  Food Insecurity: Not on file  Transportation Needs: Not on file  Physical Activity: Not on file  Stress: Not on file  Social Connections: Not on file  Intimate Partner Violence: Not on file    Outpatient Medications Prior to Visit  Medication Sig Dispense Refill  . benzonatate (TESSALON) 100 MG capsule Take 1 capsule (100  mg total) by mouth 2 (two) times daily as needed for cough. 20 capsule 0  . benzonatate (TESSALON) 100 MG capsule Take 1 capsule (100 mg total) by mouth every 8 (eight) hours. 21 capsule 0  . hydroxypropyl methylcellulose / hypromellose (ISOPTO TEARS / GONIOVISC) 2.5 % ophthalmic solution Place 1 drop into the right eye 4 (four) times daily as needed for dry eyes. 15 mL 12  . losartan (COZAAR) 25 MG tablet TAKE 1 TABLET BY MOUTH EVERY DAY 90 tablet 1  . meclizine (ANTIVERT) 25 MG tablet Take 1 tablet (25 mg total) by mouth 2 (two) times daily as needed for dizziness. 60 tablet 1  . Vitamin D, Ergocalciferol, (DRISDOL) 1.25 MG (50000 UNIT) CAPS capsule Take 1 capsule (50,000 Units total) by mouth every 7 (seven) days. 6 capsule 0  . amLODipine (NORVASC) 5 MG tablet TAKE 2 TABLETS BY MOUTH EVERY DAY 90 tablet 0  . simvastatin (ZOCOR) 40 MG tablet TAKE 1 TABLET BY MOUTH EVERY DAY 30 tablet 0  . triamcinolone cream (KENALOG) 0.1 % Apply 1 application topically 2 (two) times daily. 45 g 0   No facility-administered medications prior to visit.    Allergies  Allergen Reactions  . Codeine Nausea And Vomiting  . Vytorin [Ezetimibe-Simvastatin] Hives  .  Lipitor [Atorvastatin] Rash    ROS Review of Systems  Constitutional: Negative.   HENT: Negative.   Eyes: Negative.   Respiratory: Negative.   Cardiovascular: Negative.   Gastrointestinal: Negative.   Genitourinary: Negative.   Musculoskeletal: Negative.   Skin: Positive for rash. Negative for color change, pallor and wound.  Neurological: Negative.   Psychiatric/Behavioral: Negative.   All other systems reviewed and are negative.     Objective:    Physical Exam Vitals and nursing note reviewed.  Constitutional:      General: She is not in acute distress.    Appearance: Normal appearance. She is normal weight. She is not ill-appearing, toxic-appearing or diaphoretic.  Cardiovascular:     Rate and Rhythm: Normal rate and regular  rhythm.     Heart sounds: Normal heart sounds. No murmur heard. No friction rub. No gallop.   Pulmonary:     Effort: Pulmonary effort is normal. No respiratory distress.     Breath sounds: Normal breath sounds. No stridor. No wheezing, rhonchi or rales.  Chest:     Chest wall: No tenderness.  Skin:    General: Skin is warm and dry.     Findings: Erythema and rash (mild local rash at inj site. does not appear infected. typical vaccine reaction rash. no local lymph tenderness) present.  Neurological:     General: No focal deficit present.     Mental Status: She is alert and oriented to person, place, and time. Mental status is at baseline.  Psychiatric:        Mood and Affect: Mood normal.        Behavior: Behavior normal.        Thought Content: Thought content normal.        Judgment: Judgment normal.     BP (!) 147/87   Pulse 89   Temp 98.4 F (36.9 C) (Temporal)   Resp 18   Ht 5\' 5"  (1.651 m)   Wt 170 lb 3.2 oz (77.2 kg)   SpO2 97%   BMI 28.32 kg/m  Wt Readings from Last 3 Encounters:  04/13/20 171 lb 12.8 oz (77.9 kg)  03/08/20 168 lb 3.2 oz (76.3 kg)  02/21/20 170 lb 3.2 oz (77.2 kg)     Health Maintenance Due  Topic Date Due  . MAMMOGRAM  Never done    There are no preventive care reminders to display for this patient.  Lab Results  Component Value Date   TSH 2.970 09/06/2019   Lab Results  Component Value Date   WBC 4.2 (A) 03/08/2020   HGB 12.3 03/08/2020   HCT 37.8 03/08/2020   MCV 92.4 03/08/2020   PLT 263 02/21/2020   Lab Results  Component Value Date   NA 141 02/21/2020   K 4.2 02/21/2020   CO2 24 02/21/2020   GLUCOSE 113 (H) 02/21/2020   BUN 15 02/21/2020   CREATININE 0.88 02/21/2020   BILITOT 0.7 02/21/2020   ALKPHOS 89 02/21/2020   AST 19 02/21/2020   ALT 21 02/21/2020   PROT 7.6 02/21/2020   ALBUMIN 4.7 02/21/2020   CALCIUM 9.2 02/21/2020   ANIONGAP 6 10/31/2015   Lab Results  Component Value Date   CHOL 179 02/21/2020    Lab Results  Component Value Date   HDL 46 02/21/2020   Lab Results  Component Value Date   LDLCALC 107 (H) 02/21/2020   Lab Results  Component Value Date   TRIG 147 02/21/2020   Lab Results  Component  Value Date   CHOLHDL 3.9 02/21/2020   Lab Results  Component Value Date   HGBA1C 6.1 (H) 02/21/2020      Assessment & Plan:   Problem List Items Addressed This Visit      Musculoskeletal and Integument   Rash and nonspecific skin eruption   Relevant Medications   triamcinolone cream (KENALOG) 0.1 %    Other Visit Diagnoses    Essential hypertension    -  Primary   Relevant Orders   CBC (Completed)   Comprehensive metabolic panel (Completed)   Hemoglobin A1c (Completed)   History of elevated lipids       Relevant Orders   Lipid panel (Completed)   History of elevated glucose       Relevant Orders   Hemoglobin A1c (Completed)      Meds ordered this encounter  Medications  . triamcinolone cream (KENALOG) 0.1 %    Sig: Apply 1 application topically 2 (two) times daily.    Dispense:  45 g    Refill:  0    Order Specific Question:   Supervising Provider    Answer:   Carlota Raspberry, JEFFREY R [[id number]]  . DISCONTD: simvastatin (ZOCOR) 40 MG tablet    Sig: Take 1 tablet (40 mg total) by mouth daily.    Dispense:  30 tablet    Refill:  0    Order Specific Question:   Supervising Provider    Answer:   Carlota Raspberry, JEFFREY R [[id number]]    Follow-up: No follow-ups on file.   PLAN  Labs collected. Will follow up with the patient as warranted.  Refill simvastatin  Triamcinolone for rash - may not help much but this will be limited. If not, follow up   Patient encouraged to call clinic with any questions, comments, or concerns.  Maximiano Coss, NP

## 2020-05-06 ENCOUNTER — Other Ambulatory Visit: Payer: Self-pay | Admitting: Registered Nurse

## 2020-05-06 DIAGNOSIS — Z8639 Personal history of other endocrine, nutritional and metabolic disease: Secondary | ICD-10-CM

## 2020-05-09 ENCOUNTER — Other Ambulatory Visit: Payer: Self-pay | Admitting: Registered Nurse

## 2020-05-09 DIAGNOSIS — Z8639 Personal history of other endocrine, nutritional and metabolic disease: Secondary | ICD-10-CM

## 2020-05-30 ENCOUNTER — Other Ambulatory Visit: Payer: Self-pay | Admitting: Registered Nurse

## 2020-05-30 DIAGNOSIS — R1013 Epigastric pain: Secondary | ICD-10-CM

## 2020-06-26 ENCOUNTER — Other Ambulatory Visit: Payer: Self-pay | Admitting: Registered Nurse

## 2020-06-26 DIAGNOSIS — I10 Essential (primary) hypertension: Secondary | ICD-10-CM

## 2020-06-26 NOTE — Telephone Encounter (Signed)
Requested Prescriptions  Pending Prescriptions Disp Refills  . amLODipine (NORVASC) 5 MG tablet [Pharmacy Med Name: AMLODIPINE BESYLATE 5 MG TAB] 180 tablet 0    Sig: TAKE 2 TABLETS BY MOUTH EVERY DAY     Cardiovascular:  Calcium Channel Blockers Failed - 06/26/2020  1:25 AM      Failed - Last BP in normal range    BP Readings from Last 1 Encounters:  04/13/20 (!) 144/82         Passed - Valid encounter within last 6 months    Recent Outpatient Visits          2 months ago Onychomycosis   [facility], Delfino Lovett, NP   3 months ago Dizziness   [facility], Ranell Patrick, MD   4 months ago Essential hypertension   [facility], Delfino Lovett, NP   8 months ago COVID-19   [facility], Delfino Lovett, NP   9 months ago COVID-19   [facility], Delfino Lovett, NP

## 2020-07-09 ENCOUNTER — Emergency Department (HOSPITAL_COMMUNITY)
Admission: EM | Admit: 2020-07-09 | Discharge: 2020-07-09 | Disposition: A | Discharge status: Home / Self Care | Payer: Medicare (Managed Care) | Attending: Emergency Medicine | Admitting: Emergency Medicine

## 2020-07-09 ENCOUNTER — Other Ambulatory Visit: Payer: Self-pay

## 2020-07-09 ENCOUNTER — Encounter (HOSPITAL_COMMUNITY): Payer: Self-pay

## 2020-07-09 DIAGNOSIS — J988 Other specified respiratory disorders: Secondary | ICD-10-CM | POA: Diagnosis not present

## 2020-07-09 DIAGNOSIS — Z79899 Other long term (current) drug therapy: Secondary | ICD-10-CM | POA: Insufficient documentation

## 2020-07-09 DIAGNOSIS — R0981 Nasal congestion: Secondary | ICD-10-CM | POA: Diagnosis present

## 2020-07-09 DIAGNOSIS — J069 Acute upper respiratory infection, unspecified: Secondary | ICD-10-CM

## 2020-07-09 DIAGNOSIS — I10 Essential (primary) hypertension: Secondary | ICD-10-CM | POA: Insufficient documentation

## 2020-07-09 DIAGNOSIS — Z20822 Contact with and (suspected) exposure to covid-19: Secondary | ICD-10-CM | POA: Insufficient documentation

## 2020-07-09 LAB — CBG MONITORING, ED: Glucose-Capillary: 93 mg/dL (ref 70–99)

## 2020-07-09 NOTE — ED Notes (Signed)
Pt refused COVID swab. Dr. Roslynn Amble aware.

## 2020-07-09 NOTE — ED Notes (Signed)
An After Visit Summary was printed and given to the patient. Discharge instructions given and no further questions at this time.  

## 2020-07-09 NOTE — Discharge Instructions (Signed)
Follow-up with your primary care doctor for recheck later this week.  If you develop chest pain, difficulty breathing or other new concerning symptom, return to ER for reassessment.  Please follow-up on your Covid test results and follow the latest CDC guidelines regarding isolation precautions.

## 2020-07-09 NOTE — ED Provider Notes (Signed)
[facility] DEPT Provider Note   CSN: [id number] Arrival date & time: 07/09/20  1521     History Chief Complaint  Patient presents with  . Nasal Congestion  . Cough    Susan Berg is a 75 y.o. female.  Presented to the emergency room with concern for nasal congestion, cough.  Reports that symptoms ongoing for around 5 days.  Initially mild cough and having some nasal congestion.  Had some small yellow mucus production.  Both cough and congestion and mucus have all improved.  Has been using over-the-counter Robitussin, ginger tea.  Yesterday had drank some orange juice and afterwards felt slightly lightheaded.  No near syncope or syncope.  No chest pain or difficulty in breathing, no fevers.  No known Covid contacts.  HPI     Past Medical History:  Diagnosis Date  . High cholesterol   . Hypertension   . Vertigo   . Vitamin D deficiency     Patient Active Problem List   Diagnosis Date Noted  . Tinnitus of both ears 09/06/2019  . Rash and nonspecific skin eruption 09/06/2019    Past Surgical History:  Procedure Laterality Date  . ANKLE SURGERY       OB History   No obstetric history on file.     Family History  Problem Relation Age of Onset  . Diabetes Mother   . Rheum arthritis Mother   . Stroke Mother   . Cancer Father     Social History   Tobacco Use  . Smoking status: Never Smoker  . Smokeless tobacco: Never Used  Vaping Use  . Vaping Use: Never used  Substance Use Topics  . Alcohol use: No  . Drug use: No    Home Medications Prior to Admission medications   Medication Sig Start Date End Date Taking? Authorizing Provider  amLODipine (NORVASC) 5 MG tablet TAKE 2 TABLETS BY MOUTH EVERY DAY 06/26/20   Maximiano Coss, NP  benzonatate (TESSALON) 100 MG capsule Take 1 capsule (100 mg total) by mouth 2 (two) times daily as needed for cough. 09/16/19   Maximiano Coss, NP  benzonatate (TESSALON) 100 MG capsule Take 1  capsule (100 mg total) by mouth every 8 (eight) hours. 09/21/19   Alroy Bailiff, Margaux, PA-C  hydroxypropyl methylcellulose / hypromellose (ISOPTO TEARS / GONIOVISC) 2.5 % ophthalmic solution Place 1 drop into the right eye 4 (four) times daily as needed for dry eyes. 11/08/19   Darr, Edison Nasuti, PA-C  losartan (COZAAR) 25 MG tablet TAKE 1 TABLET BY MOUTH EVERY DAY 02/13/20   Maximiano Coss, NP  meclizine (ANTIVERT) 25 MG tablet Take 1 tablet (25 mg total) by mouth 2 (two) times daily as needed for dizziness. 08/12/19   Raylene Everts, MD  pantoprazole (PROTONIX) 40 MG tablet TAKE 1 TABLET BY MOUTH EVERY DAY 05/30/20   Maximiano Coss, NP  simvastatin (ZOCOR) 40 MG tablet TAKE 1 TABLET BY MOUTH EVERY DAY 05/09/20   Maximiano Coss, NP  terbinafine (LAMISIL AT) 1 % cream Apply 1 application topically 2 (two) times daily. 04/13/20   Maximiano Coss, NP  terbinafine (LAMISIL) 250 MG tablet Take 1 tablet (250 mg total) by mouth daily. 04/13/20   Maximiano Coss, NP  triamcinolone cream (KENALOG) 0.1 % Apply 1 application topically 2 (two) times daily. 02/21/20   Maximiano Coss, NP  Vitamin D, Ergocalciferol, (DRISDOL) 1.25 MG (50000 UNIT) CAPS capsule Take 1 capsule (50,000 Units total) by mouth every 7 (seven) days. 09/08/19  Maximiano Coss, NP  fexofenadine (ALLEGRA) 180 MG tablet Take 1 tablet (180 mg total) by mouth daily. Patient taking differently: Take 180 mg by mouth daily as needed for allergies.  08/22/15 04/15/19  Billy Fischer, MD  fluticasone (FLONASE) 50 MCG/ACT nasal spray Place 1 spray into both nostrils 2 (two) times daily. Patient taking differently: Place 1 spray into both nostrils daily as needed for allergies.  08/22/15 09/15/19  Billy Fischer, MD    Allergies    Codeine, Vytorin [ezetimibe-simvastatin], and Lipitor [atorvastatin]  Review of Systems   Review of Systems  Constitutional: Negative for chills and fever.  HENT: Positive for congestion. Negative for ear pain and sore throat.    Eyes: Negative for pain and visual disturbance.  Respiratory: Positive for cough. Negative for shortness of breath.   Cardiovascular: Negative for chest pain and palpitations.  Gastrointestinal: Negative for abdominal pain and vomiting.  Genitourinary: Negative for dysuria and hematuria.  Musculoskeletal: Negative for arthralgias and back pain.  Skin: Negative for color change and rash.  Neurological: Negative for seizures and syncope.  All other systems reviewed and are negative.   Physical Exam Updated Vital Signs BP (!) 137/98   Pulse 72   Temp 98.5 F (36.9 C) (Oral)   Resp 18   SpO2 99%   Physical Exam Vitals and nursing note reviewed.  Constitutional:      General: She is not in acute distress.    Appearance: She is well-developed and well-nourished.  HENT:     Head: Normocephalic and atraumatic.     Nose: Nose normal.     Mouth/Throat:     Mouth: Mucous membranes are moist.  Eyes:     Conjunctiva/sclera: Conjunctivae normal.  Cardiovascular:     Rate and Rhythm: Normal rate and regular rhythm.     Heart sounds: No murmur heard.   Pulmonary:     Effort: Pulmonary effort is normal. No respiratory distress.     Breath sounds: Normal breath sounds.  Abdominal:     Palpations: Abdomen is soft.     Tenderness: There is no abdominal tenderness.  Musculoskeletal:        General: No deformity, signs of injury or edema.     Cervical back: Neck supple.  Skin:    General: Skin is warm and dry.     Capillary Refill: Capillary refill takes less than 2 seconds.  Neurological:     General: No focal deficit present.     Mental Status: She is alert.  Psychiatric:        Mood and Affect: Mood and affect and mood normal.        Behavior: Behavior normal.     ED Results / Procedures / Treatments   Labs (all labs ordered are listed, but only abnormal results are displayed) Labs Reviewed  SARS CORONAVIRUS 2 (TAT 6-24 HRS)  CBG MONITORING, ED     EKG None  Radiology No results found.  Procedures Procedures   Medications Ordered in ED Medications - No data to display  ED Course  I have reviewed the triage vital signs and the nursing notes.  Pertinent labs & imaging results that were available during my care of the patient were reviewed by me and considered in my medical decision making (see chart for details).    MDM Rules/Calculators/A&P                         75 year old lady who  presented to the ER with concern for cough, congestion over the past 5 days.  On physical exam, patient remarkably well-appearing.  Vital signs are stable, she is afebrile, her lungs are clear.  Suspect viral upper respiratory illness.  We will send Covid testing.  Reviewed return precautions and discharged home.   After the discussed management above, the patient was determined to be safe for discharge.  The patient was in agreement with this plan and all questions regarding their care were answered.  ED return precautions were discussed and the patient will return to the ED with any significant worsening of condition.  Susan Berg was evaluated in Emergency Department on 07/09/2020 for the symptoms described in the history of present illness. She was evaluated in the context of the global COVID-19 pandemic, which necessitated consideration that the patient might be at risk for infection with the SARS-CoV-2 virus that causes COVID-19. Institutional protocols and algorithms that pertain to the evaluation of patients at risk for COVID-19 are in a state of rapid change based on information released by regulatory bodies including the CDC and federal and state organizations. These policies and algorithms were followed during the patient's care in the ED.  Final Clinical Impression(s) / ED Diagnoses Final diagnoses:  Viral upper respiratory illness  Suspected COVID-19 virus infection    Rx / DC Orders ED Discharge Orders    None        Lucrezia Starch, MD 07/09/20 1756

## 2020-07-09 NOTE — ED Notes (Signed)
CBG 93 

## 2020-07-09 NOTE — ED Triage Notes (Addendum)
Pt c/o cough, congestion x5 days. Pt states she is not coughing up mucous anymore. Pt states over the counter meds have helped. Pt denies chest pain, denies SOB.

## 2020-08-05 ENCOUNTER — Other Ambulatory Visit: Payer: Self-pay | Admitting: Registered Nurse

## 2020-09-12 ENCOUNTER — Other Ambulatory Visit: Payer: Self-pay

## 2020-09-12 ENCOUNTER — Ambulatory Visit (INDEPENDENT_AMBULATORY_CARE_PROVIDER_SITE_OTHER): Payer: Medicare (Managed Care) | Admitting: Registered Nurse

## 2020-09-12 ENCOUNTER — Encounter: Payer: Self-pay | Admitting: Registered Nurse

## 2020-09-12 VITALS — BP 144/73 | HR 93 | Temp 98.0°F | Resp 18 | Ht 65.0 in | Wt 178.0 lb

## 2020-09-12 DIAGNOSIS — R739 Hyperglycemia, unspecified: Secondary | ICD-10-CM

## 2020-09-12 DIAGNOSIS — I10 Essential (primary) hypertension: Secondary | ICD-10-CM

## 2020-09-12 DIAGNOSIS — J301 Allergic rhinitis due to pollen: Secondary | ICD-10-CM

## 2020-09-12 DIAGNOSIS — E559 Vitamin D deficiency, unspecified: Secondary | ICD-10-CM

## 2020-09-12 DIAGNOSIS — D171 Benign lipomatous neoplasm of skin and subcutaneous tissue of trunk: Secondary | ICD-10-CM

## 2020-09-12 DIAGNOSIS — Z8639 Personal history of other endocrine, nutritional and metabolic disease: Secondary | ICD-10-CM | POA: Diagnosis not present

## 2020-09-12 DIAGNOSIS — Z1231 Encounter for screening mammogram for malignant neoplasm of breast: Secondary | ICD-10-CM

## 2020-09-12 DIAGNOSIS — B351 Tinea unguium: Secondary | ICD-10-CM

## 2020-09-12 LAB — CBC WITH DIFFERENTIAL/PLATELET
Basophils Absolute: 0 10*3/uL (ref 0.0–0.1)
Basophils Relative: 0.8 % (ref 0.0–3.0)
Eosinophils Absolute: 0.1 10*3/uL (ref 0.0–0.7)
Eosinophils Relative: 3.4 % (ref 0.0–5.0)
HCT: 35.5 % — ABNORMAL LOW (ref 36.0–46.0)
Hemoglobin: 11.9 g/dL — ABNORMAL LOW (ref 12.0–15.0)
Lymphocytes Relative: 26 % (ref 12.0–46.0)
Lymphs Abs: 1.1 10*3/uL (ref 0.7–4.0)
MCHC: 33.5 g/dL (ref 30.0–36.0)
MCV: 91 fl (ref 78.0–100.0)
Monocytes Absolute: 0.2 10*3/uL (ref 0.1–1.0)
Monocytes Relative: 5.4 % (ref 3.0–12.0)
Neutro Abs: 2.8 10*3/uL (ref 1.4–7.7)
Neutrophils Relative %: 64.4 % (ref 43.0–77.0)
Platelets: 283 10*3/uL (ref 150.0–400.0)
RBC: 3.9 Mil/uL (ref 3.87–5.11)
RDW: 13.1 % (ref 11.5–15.5)
WBC: 4.3 10*3/uL (ref 4.0–10.5)

## 2020-09-12 LAB — LIPID PANEL
Cholesterol: 198 mg/dL (ref 0–200)
HDL: 36.8 mg/dL — ABNORMAL LOW (ref >39.00)
LDL Cholesterol: 143 mg/dL — ABNORMAL HIGH (ref 0–99)
NonHDL: 160.7
Total CHOL/HDL Ratio: 5
Triglycerides: 91 mg/dL (ref 0.0–149.0)
VLDL: 18.2 mg/dL (ref 0.0–40.0)

## 2020-09-12 LAB — COMPREHENSIVE METABOLIC PANEL
ALT: 17 U/L (ref 0–35)
AST: 17 U/L (ref 0–37)
Albumin: 4.5 g/dL (ref 3.5–5.2)
Alkaline Phosphatase: 68 U/L (ref 39–117)
BUN: 13 mg/dL (ref 6–23)
CO2: 29 mEq/L (ref 19–32)
Calcium: 9.6 mg/dL (ref 8.4–10.5)
Chloride: 104 mEq/L (ref 96–112)
Creatinine, Ser: 0.76 mg/dL (ref 0.40–1.20)
GFR: 77.86 mL/min (ref >60.00)
Glucose, Bld: 110 mg/dL — ABNORMAL HIGH (ref 70–99)
Potassium: 4 mEq/L (ref 3.5–5.1)
Sodium: 141 mEq/L (ref 135–145)
Total Bilirubin: 1 mg/dL (ref 0.2–1.2)
Total Protein: 7.5 g/dL (ref 6.0–8.3)

## 2020-09-12 LAB — VITAMIN D 25 HYDROXY (VIT D DEFICIENCY, FRACTURES): VITD: 21.13 ng/mL — ABNORMAL LOW (ref 30.00–100.00)

## 2020-09-12 LAB — TSH: TSH: 1.11 u[IU]/mL (ref 0.35–4.50)

## 2020-09-12 LAB — HEMOGLOBIN A1C: Hgb A1c MFr Bld: 6 % (ref 4.6–6.5)

## 2020-09-12 MED ORDER — AZELASTINE HCL 0.1 % NA SOLN: 1.0000 | Freq: Two times a day (BID) | NASAL | 12 refills | Status: DC

## 2020-09-12 NOTE — Progress Notes (Signed)
Established Patient Office Visit  Subjective:  Patient ID: Susan Berg, female    DOB: [birth year]  Age: 75 y.o. MRN: [id number]  CC:  Chief Complaint  Patient presents with  . Follow-up    Patient states she is here for an follow up    HPI Susan Berg presents for follow up on chronic conditions  Hypertension: Patient Currently taking: amlodipine 5mg  PO qd, losartan 25mg  PO qd Good effect. No AEs. Denies CV symptoms including: chest pain, shob, doe, headache, visual changes, fatigue, claudication, and dependent edema.   Previous readings and labs: BP Readings from Last 3 Encounters:  09/12/20 (!) 144/73  07/09/20 131/75  04/13/20 (!) 144/82   Lab Results  Component Value Date   CREATININE 0.88 02/21/2020    Hyperglycemia: last a1c was prediabetic. Will recheck today. Lifestyle control  HLD: conscious of diet. No cv symptoms. Feeling well overall.  Notes today:  Onychomycosis: Did not take terbinafine PO. No improvement. Nails still yellow, brittle, peeling. No involvement of nail bed. Stable overall.  Cannot cut nails, would like referral to podiatry.  Lump on chest Subclavicular. Firm, round. Moderately deep. Mobile. Nontender. No drainage or head. Has seen dermatology in the past and was told it was lipoma - has not changed in some time, wants to ensure this was good assessment.  Seasonal allergies Rhinitis, has used allegra and benadryl with some relief Worse when outside, intermittent symptoms No cough or congestion, no lower respiratory symptoms, no sick contacts.  Otherwise feeling well.   Past Medical History:  Diagnosis Date  . High cholesterol   . Hypertension   . Vertigo   . Vitamin D deficiency     Past Surgical History:  Procedure Laterality Date  . ANKLE SURGERY      Family History  Problem Relation Age of Onset  . Diabetes Mother   . Rheum arthritis Mother   . Stroke Mother   . Cancer Father     Social History    Socioeconomic History  . Marital status: Widowed    Spouse name: Not on file  . Number of children: 2  . Years of education: Not on file  . Highest education level: Not on file  Occupational History  . Not on file  Tobacco Use  . Smoking status: Never Smoker  . Smokeless tobacco: Never Used  Vaping Use  . Vaping Use: Never used  Substance and Sexual Activity  . Alcohol use: No  . Drug use: No  . Sexual activity: Not on file  Other Topics Concern  . Not on file  Social History Narrative  . Not on file   Social Determinants of Health   Financial Resource Strain: Not on file  Food Insecurity: Not on file  Transportation Needs: Not on file  Physical Activity: Not on file  Stress: Not on file  Social Connections: Not on file  Intimate Partner Violence: Not on file    Outpatient Medications Prior to Visit  Medication Sig Dispense Refill  . amLODipine (NORVASC) 5 MG tablet TAKE 2 TABLETS BY MOUTH EVERY DAY 180 tablet 0  . benzonatate (TESSALON) 100 MG capsule Take 1 capsule (100 mg total) by mouth 2 (two) times daily as needed for cough. 20 capsule 0  . benzonatate (TESSALON) 100 MG capsule Take 1 capsule (100 mg total) by mouth every 8 (eight) hours. 21 capsule 0  . hydroxypropyl methylcellulose / hypromellose (ISOPTO TEARS / GONIOVISC) 2.5 % ophthalmic solution Place 1 drop into  the right eye 4 (four) times daily as needed for dry eyes. 15 mL 12  . losartan (COZAAR) 25 MG tablet TAKE 1 TABLET BY MOUTH EVERY DAY 90 tablet 1  . meclizine (ANTIVERT) 25 MG tablet Take 1 tablet (25 mg total) by mouth 2 (two) times daily as needed for dizziness. 60 tablet 1  . pantoprazole (PROTONIX) 40 MG tablet TAKE 1 TABLET BY MOUTH EVERY DAY 90 tablet 1  . simvastatin (ZOCOR) 40 MG tablet TAKE 1 TABLET BY MOUTH EVERY DAY 90 tablet 1  . terbinafine (LAMISIL AT) 1 % cream Apply 1 application topically 2 (two) times daily. 42 g 3  . terbinafine (LAMISIL) 250 MG tablet Take 1 tablet (250 mg  total) by mouth daily. 84 tablet 0  . triamcinolone cream (KENALOG) 0.1 % Apply 1 application topically 2 (two) times daily. 45 g 0  . Vitamin D, Ergocalciferol, (DRISDOL) 1.25 MG (50000 UNIT) CAPS capsule Take 1 capsule (50,000 Units total) by mouth every 7 (seven) days. 6 capsule 0   No facility-administered medications prior to visit.    Allergies  Allergen Reactions  . Codeine Nausea And Vomiting  . Vytorin [Ezetimibe-Simvastatin] Hives  . Lipitor [Atorvastatin] Rash    ROS Review of Systems  Constitutional: Negative.   HENT: Negative.   Eyes: Negative.   Respiratory: Negative.   Cardiovascular: Negative.   Gastrointestinal: Negative.   Genitourinary: Negative.   Musculoskeletal: Negative.   Skin: Negative.   Neurological: Negative.   Psychiatric/Behavioral: Negative.   All other systems reviewed and are negative.     Objective:    Physical Exam Vitals and nursing note reviewed.  Constitutional:      General: She is not in acute distress.    Appearance: Normal appearance. She is normal weight. She is not ill-appearing, toxic-appearing or diaphoretic.  Cardiovascular:     Rate and Rhythm: Normal rate and regular rhythm.     Heart sounds: Normal heart sounds. No murmur heard. No friction rub. No gallop.   Pulmonary:     Effort: Pulmonary effort is normal. No respiratory distress.     Breath sounds: Normal breath sounds. No stridor. No wheezing, rhonchi or rales.  Chest:     Chest wall: No tenderness.  Feet:     Right foot:     Toenail Condition: Right toenails are abnormally thick. Fungal disease present.    Left foot:     Toenail Condition: Left toenails are abnormally thick. Fungal disease present. Skin:    General: Skin is warm and dry.     Findings: Lesion (lipoma upper right chest) present.  Neurological:     General: No focal deficit present.     Mental Status: She is alert and oriented to person, place, and time. Mental status is at baseline.   Psychiatric:        Mood and Affect: Mood normal.        Behavior: Behavior normal.        Thought Content: Thought content normal.        Judgment: Judgment normal.     BP (!) 144/73   Pulse 93   Temp 98 F (36.7 C) (Temporal)   Resp 18   Ht 5\' 5"  (1.651 m)   Wt 178 lb (80.7 kg)   SpO2 99%   BMI 29.62 kg/m  Wt Readings from Last 3 Encounters:  09/12/20 178 lb (80.7 kg)  04/13/20 171 lb 12.8 oz (77.9 kg)  03/08/20 168 lb 3.2 oz (76.3 kg)  Health Maintenance Due  Topic Date Due  . MAMMOGRAM  Never done    There are no preventive care reminders to display for this patient.  Lab Results  Component Value Date   TSH 2.970 09/06/2019   Lab Results  Component Value Date   WBC 4.2 (A) 03/08/2020   HGB 12.3 03/08/2020   HCT 37.8 03/08/2020   MCV 92.4 03/08/2020   PLT 263 02/21/2020   Lab Results  Component Value Date   NA 141 02/21/2020   K 4.2 02/21/2020   CO2 24 02/21/2020   GLUCOSE 113 (H) 02/21/2020   BUN 15 02/21/2020   CREATININE 0.88 02/21/2020   BILITOT 0.7 02/21/2020   ALKPHOS 89 02/21/2020   AST 19 02/21/2020   ALT 21 02/21/2020   PROT 7.6 02/21/2020   ALBUMIN 4.7 02/21/2020   CALCIUM 9.2 02/21/2020   ANIONGAP 6 10/31/2015   Lab Results  Component Value Date   CHOL 179 02/21/2020   Lab Results  Component Value Date   HDL 46 02/21/2020   Lab Results  Component Value Date   LDLCALC 107 (H) 02/21/2020   Lab Results  Component Value Date   TRIG 147 02/21/2020   Lab Results  Component Value Date   CHOLHDL 3.9 02/21/2020   Lab Results  Component Value Date   HGBA1C 6.1 (H) 02/21/2020      Assessment & Plan:   Problem List Items Addressed This Visit   None   Visit Diagnoses    Vitamin D deficiency    -  Primary   Relevant Orders   Vitamin D (25 hydroxy)   Hyperglycemia       Relevant Orders   Hemoglobin A1c   Essential hypertension       Relevant Orders   CBC with Differential/Platelet   Comprehensive metabolic panel    TSH   History of elevated lipids       Relevant Orders   Lipid panel   Breast cancer screening by mammogram       Relevant Orders   MM Digital Screening   Seasonal allergic rhinitis due to pollen       Relevant Medications   azelastine (ASTELIN) 0.1 % nasal spray      Meds ordered this encounter  Medications  . azelastine (ASTELIN) 0.1 % nasal spray    Sig: Place 1 spray into both nostrils 2 (two) times daily. Use in each nostril as directed    Dispense:  30 mL    Refill:  12    Order Specific Question:   Supervising Provider    Answer:   Carlota Raspberry, JEFFREY R [[id number]]    Follow-up: No follow-ups on file.   PLAN  Azelastine for rhinitis  Refer to podiatry. Encourage her to start terbinafine  Return in 6 mo  Labs collected. Will follow up with the patient as warranted.  Patient encouraged to call clinic with any questions, comments, or concerns.  Susan Coss, NP

## 2020-09-12 NOTE — Progress Notes (Signed)
144/73 

## 2020-09-12 NOTE — Patient Instructions (Signed)
[name] to see you  Labs will be back tomorrow  I've placed the referral to the foot doctor / podiatrist. They will call you directly.  The lump on your chest seems entirely benign. If it's not causing you any concerns, let's leave it alone until it does - though it may never be a problem.  Let's plan on a follow up in 6 mo  Please come back sooner if you have any worries!  Thank you  Rich

## 2020-09-13 ENCOUNTER — Encounter: Payer: Self-pay | Admitting: Registered Nurse

## 2020-09-18 ENCOUNTER — Ambulatory Visit: Payer: Medicare (Managed Care) | Admitting: Podiatry

## 2020-09-19 ENCOUNTER — Ambulatory Visit (INDEPENDENT_AMBULATORY_CARE_PROVIDER_SITE_OTHER): Payer: Medicare (Managed Care) | Admitting: Podiatry

## 2020-09-19 ENCOUNTER — Other Ambulatory Visit: Payer: Self-pay

## 2020-09-19 ENCOUNTER — Ambulatory Visit (INDEPENDENT_AMBULATORY_CARE_PROVIDER_SITE_OTHER): Payer: Medicare (Managed Care)

## 2020-09-19 DIAGNOSIS — B351 Tinea unguium: Secondary | ICD-10-CM

## 2020-09-19 DIAGNOSIS — S91331A Puncture wound without foreign body, right foot, initial encounter: Secondary | ICD-10-CM | POA: Diagnosis not present

## 2020-09-19 DIAGNOSIS — M79674 Pain in right toe(s): Secondary | ICD-10-CM

## 2020-09-19 NOTE — Progress Notes (Signed)
   Subjective: 75 y.o. female presenting today for evaluation of what has been diagnosed as onychomycosis of the right hallux nail plate. She is currently taking oral lamisil that has been prescribed by her PCP.    She is also concerned that she possibly stepped on something to her right foot yesterday.  She noticed some sharp pain with some slight bleeding.  She would like to have it evaluated.  She presents for further treatment and evaluation  Past Medical History:  Diagnosis Date  . High cholesterol   . Hypertension   . Vertigo   . Vitamin D deficiency     Objective: Physical Exam General: The patient is alert and oriented x3 in no acute distress.  Dermatology: Hyperkeratotic, discolored, thickened, onychodystrophy noted to the right hallux nail plate. Skin is warm, dry and supple bilateral lower extremities.   There is a very small superficial wound to the plantar arch of the right foot.  It does not appear to extend beyond the skin.  There is very superficial and small approximately 2 mm in length.  Vascular: Palpable pedal pulses bilaterally. No edema or erythema noted. Capillary refill within normal limits.  Neurological: Epicritic and protective threshold grossly intact bilaterally.   Musculoskeletal Exam: No pedal deformities noted  Assessment: #1 Onychomycosis of toenail right hallux nail plate #2 small superficial laceration plantar arch right foot limited to breakdown of skin  Plan of Care:  #1 Patient was evaluated. #2 light debridement was performed to the right hallux nail plate 3.  Recommend antibiotic ointment and a Band-Aid to the right foot x3 days 4.  Continue oral Lamisil as prescribed by PCP  5.  Return to clinic as needed   Edrick Kins, DPM [facility]  Dr. Edrick Kins, DPM    [address] [place], [place] [address]                Office [phone]  Fax [phone]

## 2020-09-28 ENCOUNTER — Other Ambulatory Visit: Payer: Self-pay | Admitting: Registered Nurse

## 2020-09-28 DIAGNOSIS — I10 Essential (primary) hypertension: Secondary | ICD-10-CM

## 2020-10-05 ENCOUNTER — Telehealth: Payer: Self-pay | Admitting: Registered Nurse

## 2020-10-05 NOTE — Telephone Encounter (Signed)
Pt called in stating that she received her lab results. She wanted to know how Richard wanted her to take the Vit D// please advise

## 2020-10-05 NOTE — Telephone Encounter (Signed)
Called patient back in reference to taking vitamin D per pcp orders in letter that was sent to patient.

## 2020-10-18 ENCOUNTER — Other Ambulatory Visit: Payer: Self-pay | Admitting: Registered Nurse

## 2020-10-18 DIAGNOSIS — Z8639 Personal history of other endocrine, nutritional and metabolic disease: Secondary | ICD-10-CM

## 2021-01-10 ENCOUNTER — Encounter: Payer: Self-pay | Admitting: Podiatry

## 2021-01-10 ENCOUNTER — Other Ambulatory Visit: Payer: Self-pay

## 2021-01-10 ENCOUNTER — Ambulatory Visit (INDEPENDENT_AMBULATORY_CARE_PROVIDER_SITE_OTHER): Payer: Medicare (Managed Care) | Admitting: Podiatry

## 2021-01-10 DIAGNOSIS — L600 Ingrowing nail: Secondary | ICD-10-CM | POA: Diagnosis not present

## 2021-01-10 DIAGNOSIS — B351 Tinea unguium: Secondary | ICD-10-CM

## 2021-01-10 DIAGNOSIS — M79674 Pain in right toe(s): Secondary | ICD-10-CM

## 2021-01-10 NOTE — Progress Notes (Signed)
Subjective:   Patient ID: Susan Berg, female   DOB: 75 y.o.   MRN: [id number]   HPI Patient states that she is been very concerned about the condition of her nailbeds and states her right big toenail has fallen off recently it is thick and she wants to know what treatment might be offered for this neuro   ROS      Objective:  Physical Exam  Vascular status found to be intact bilateral with thick yellow brittle nailbeds 1-5 both feet with the right hallux showing a abnormal appearance and loss of a portion of the nail localized to this area     Assessment:  Mycotic nail infection of both feet with damaged right hallux nail bed     Plan:  H&P reviewed different treatment options for this and discussed different oral medications topical medicines or debridement techniques for possible nail removal.  At this point she is can opt for topical and I did smooth out nail beds and this can be done periodically and may require more pressor treatment at 1

## 2021-01-17 ENCOUNTER — Other Ambulatory Visit: Payer: Self-pay | Admitting: *Deleted

## 2021-01-17 ENCOUNTER — Ambulatory Visit (INDEPENDENT_AMBULATORY_CARE_PROVIDER_SITE_OTHER): Payer: Medicare (Managed Care) | Admitting: *Deleted

## 2021-01-17 DIAGNOSIS — Z780 Asymptomatic menopausal state: Secondary | ICD-10-CM

## 2021-01-17 DIAGNOSIS — Z0000 Encounter for general adult medical examination without abnormal findings: Secondary | ICD-10-CM | POA: Diagnosis not present

## 2021-01-17 NOTE — Patient Instructions (Signed)
[name] , Thank you for taking time to come for your Medicare Wellness Visit. I appreciate your ongoing commitment to your health goals. Please review the following plan we discussed and let me know if I can assist you in the future.   Screening recommendations/referrals: Colonoscopy: up to date Mammogram: Education provided Bone Density: Education provided Recommended yearly ophthalmology/optometry visit for glaucoma screening and checkup Recommended yearly dental visit for hygiene and checkup  Vaccinations: Influenza vaccine: Education provided Pneumococcal vaccine: Education provided Tdap vaccine: up to date Shingles vaccine: Education provided    Advanced directives: Education provided  Conditions/risks identified  Next appointment: 01-25-2021 @ 1:10 Susan Coss NP   Preventive Care 40 Years and Older, Female Preventive care refers to lifestyle choices and visits with your health care provider that can promote health and wellness. What does preventive care include? A yearly physical exam. This is also called an annual well check. Dental exams once or twice a year. Routine eye exams. Ask your health care provider how often you should have your eyes checked. Personal lifestyle choices, including: Daily care of your teeth and gums. Regular physical activity. Eating a healthy diet. Avoiding tobacco and drug use. Limiting alcohol use. Practicing safe sex. Taking low-dose aspirin every day. Taking vitamin and mineral supplements as recommended by your health care provider. What happens during an annual well check? The services and screenings done by your health care provider during your annual well check will depend on your age, overall health, lifestyle risk factors, and family history of disease. Counseling  Your health care provider may ask you questions about your: Alcohol use. Tobacco use. Drug use. Emotional well-being. Home and relationship well-being. Sexual  activity. Eating habits. History of falls. Memory and ability to understand (cognition). Work and work Statistician. Reproductive health. Screening  You may have the following tests or measurements: Height, weight, and BMI. Blood pressure. Lipid and cholesterol levels. These may be checked every 5 years, or more frequently if you are over 41 years old. Skin check. Lung cancer screening. You may have this screening every year starting at age 45 if you have a 30-pack-year history of smoking and currently smoke or have quit within the past 15 years. Fecal occult blood test (FOBT) of the stool. You may have this test every year starting at age 2. Flexible sigmoidoscopy or colonoscopy. You may have a sigmoidoscopy every 5 years or a colonoscopy every 10 years starting at age 75. Hepatitis C blood test. Hepatitis B blood test. Sexually transmitted disease (STD) testing. Diabetes screening. This is done by checking your blood sugar (glucose) after you have not eaten for a while (fasting). You may have this done every 1-3 years. Bone density scan. This is done to screen for osteoporosis. You may have this done starting at age 37. Mammogram. This may be done every 1-2 years. Talk to your health care provider about how often you should have regular mammograms. Talk with your health care provider about your test results, treatment options, and if necessary, the need for more tests. Vaccines  Your health care provider may recommend certain vaccines, such as: Influenza vaccine. This is recommended every year. Tetanus, diphtheria, and acellular pertussis (Tdap, Td) vaccine. You may need a Td booster every 10 years. Zoster vaccine. You may need this after age 24. Pneumococcal 13-valent conjugate (PCV13) vaccine. One dose is recommended after age 35. Pneumococcal polysaccharide (PPSV23) vaccine. One dose is recommended after age 2. Talk to your health care provider about which screenings  and vaccines  you need and how often you need them. This information is not intended to replace advice given to you by your health care provider. Make sure you discuss any questions you have with your health care provider. Document Released: 05/25/2015 Document Revised: 01/16/2016 Document Reviewed: 02/27/2015 Elsevier Interactive Patient Education  2017 [employer] Prevention in the Home Falls can cause injuries. They can happen to people of all ages. There are many things you can do to make your home safe and to help prevent falls. What can I do on the outside of my home? Regularly fix the edges of walkways and driveways and fix any cracks. Remove anything that might make you trip as you walk through a door, such as a raised step or threshold. Trim any bushes or trees on the path to your home. Use bright outdoor lighting. Clear any walking paths of anything that might make someone trip, such as rocks or tools. Regularly check to see if handrails are loose or broken. Make sure that both sides of any steps have handrails. Any raised decks and porches should have guardrails on the edges. Have any leaves, snow, or ice cleared regularly. Use sand or salt on walking paths during winter. Clean up any spills in your garage right away. This includes oil or grease spills. What can I do in the bathroom? Use night lights. Install grab bars by the toilet and in the tub and shower. Do not use towel bars as grab bars. Use non-skid mats or decals in the tub or shower. If you need to sit down in the shower, use a plastic, non-slip stool. Keep the floor dry. Clean up any water that spills on the floor as soon as it happens. Remove soap buildup in the tub or shower regularly. Attach bath mats securely with double-sided non-slip rug tape. Do not have throw rugs and other things on the floor that can make you trip. What can I do in the bedroom? Use night lights. Make sure that you have a light by your bed that  is easy to reach. Do not use any sheets or blankets that are too big for your bed. They should not hang down onto the floor. Have a firm chair that has side arms. You can use this for support while you get dressed. Do not have throw rugs and other things on the floor that can make you trip. What can I do in the kitchen? Clean up any spills right away. Avoid walking on wet floors. Keep items that you use a lot in easy-to-reach places. If you need to reach something above you, use a strong step stool that has a grab bar. Keep electrical cords out of the way. Do not use floor polish or wax that makes floors slippery. If you must use wax, use non-skid floor wax. Do not have throw rugs and other things on the floor that can make you trip. What can I do with my stairs? Do not leave any items on the stairs. Make sure that there are handrails on both sides of the stairs and use them. Fix handrails that are broken or loose. Make sure that handrails are as long as the stairways. Check any carpeting to make sure that it is firmly attached to the stairs. Fix any carpet that is loose or worn. Avoid having throw rugs at the top or bottom of the stairs. If you do have throw rugs, attach them to the floor with carpet tape.  Make sure that you have a light switch at the top of the stairs and the bottom of the stairs. If you do not have them, ask someone to add them for you. What else can I do to help prevent falls? Wear shoes that: Do not have high heels. Have rubber bottoms. Are comfortable and fit you well. Are closed at the toe. Do not wear sandals. If you use a stepladder: Make sure that it is fully opened. Do not climb a closed stepladder. Make sure that both sides of the stepladder are locked into place. Ask someone to hold it for you, if possible. Clearly mark and make sure that you can see: Any grab bars or handrails. First and last steps. Where the edge of each step is. Use tools that help you  move around (mobility aids) if they are needed. These include: Canes. Walkers. Scooters. Crutches. Turn on the lights when you go into a dark area. Replace any light bulbs as soon as they burn out. Set up your furniture so you have a clear path. Avoid moving your furniture around. If any of your floors are uneven, fix them. If there are any pets around you, be aware of where they are. Review your medicines with your doctor. Some medicines can make you feel dizzy. This can increase your chance of falling. Ask your doctor what other things that you can do to help prevent falls. This information is not intended to replace advice given to you by your health care provider. Make sure you discuss any questions you have with your health care provider. Document Released: 02/22/2009 Document Revised: 10/04/2015 Document Reviewed: 06/02/2014 Elsevier Interactive Patient Education  2017 [employer].

## 2021-01-17 NOTE — Progress Notes (Signed)
Subjective:   Susan Berg is a 75 y.o. female who presents for Medicare Annual (Subsequent) preventive examination.  I connected with  Susan Berg on 01/17/21 by a telephone enabled telemedicine application and verified that I am speaking with the correct person using two identifiers.   I discussed the limitations of evaluation and management by telemedicine. The patient expressed understanding and agreed to proceed.   Review of Systems     Cardiac Risk Factors include: advanced age (>6mn, >>31women);hypertension     Objective:    Today's Vitals   There is no height or weight on file to calculate BMI.  Advanced Directives 01/17/2021 07/09/2020 09/15/2019 05/22/2016 10/31/2015 03/06/2015 10/05/2014  Does Patient Have a Medical Advance Directive? No No No No No No No  Would patient like information on creating a medical advance directive? No - Patient declined No - Patient declined No - Patient declined Yes (ED - Information included in AVS) No - patient declined information No - patient declined information -    Current Medications (verified) Outpatient Encounter Medications as of 01/17/2021  Medication Sig   amLODipine (NORVASC) 5 MG tablet TAKE 2 TABLETS BY MOUTH EVERY DAY   azelastine (ASTELIN) 0.1 % nasal spray Place 1 spray into both nostrils 2 (two) times daily. Use in each nostril as directed   benzonatate (TESSALON) 100 MG capsule Take 1 capsule (100 mg total) by mouth 2 (two) times daily as needed for cough.   benzonatate (TESSALON) 100 MG capsule Take 1 capsule (100 mg total) by mouth every 8 (eight) hours.   hydroxypropyl methylcellulose / hypromellose (ISOPTO TEARS / GONIOVISC) 2.5 % ophthalmic solution Place 1 drop into the right eye 4 (four) times daily as needed for dry eyes.   losartan (COZAAR) 25 MG tablet TAKE 1 TABLET BY MOUTH EVERY DAY   meclizine (ANTIVERT) 25 MG tablet Take 1 tablet (25 mg total) by mouth 2 (two) times daily as needed for dizziness.    pantoprazole (PROTONIX) 40 MG tablet TAKE 1 TABLET BY MOUTH EVERY DAY   simvastatin (ZOCOR) 40 MG tablet TAKE 1 TABLET BY MOUTH EVERY DAY   terbinafine (LAMISIL AT) 1 % cream Apply 1 application topically 2 (two) times daily.   terbinafine (LAMISIL) 250 MG tablet Take 1 tablet (250 mg total) by mouth daily.   triamcinolone cream (KENALOG) 0.1 % Apply 1 application topically 2 (two) times daily.   Vitamin D, Ergocalciferol, (DRISDOL) 1.25 MG (50000 UNIT) CAPS capsule Take 1 capsule (50,000 Units total) by mouth every 7 (seven) days.   [DISCONTINUED] fexofenadine (ALLEGRA) 180 MG tablet Take 1 tablet (180 mg total) by mouth daily. (Patient taking differently: Take 180 mg by mouth daily as needed for allergies. )   [DISCONTINUED] fluticasone (FLONASE) 50 MCG/ACT nasal spray Place 1 spray into both nostrils 2 (two) times daily. (Patient taking differently: Place 1 spray into both nostrils daily as needed for allergies. )   No facility-administered encounter medications on file as of 01/17/2021.    Allergies (verified) Codeine, Vytorin [ezetimibe-simvastatin], and Lipitor [atorvastatin]   History: Past Medical History:  Diagnosis Date   High cholesterol    Hypertension    Vertigo    Vitamin D deficiency    Past Surgical History:  Procedure Laterality Date   ANKLE SURGERY     Family History  Problem Relation Age of Onset   Diabetes Mother    Rheum arthritis Mother    Stroke Mother    Cancer Father  Social History   Socioeconomic History   Marital status: Widowed    Spouse name: Not on file   Number of children: 2   Years of education: Not on file   Highest education level: Not on file  Occupational History   Not on file  Tobacco Use   Smoking status: Never   Smokeless tobacco: Never  Vaping Use   Vaping Use: Never used  Substance and Sexual Activity   Alcohol use: No   Drug use: No   Sexual activity: Not on file  Other Topics Concern   Not on file  Social History  Narrative   Not on file   Social Determinants of Health   Financial Resource Strain: Low Risk    Difficulty of Paying Living Expenses: Not hard at all  Food Insecurity: No Food Insecurity   Worried About Running Out of Food in the Last Year: Never true   [place] in the Last Year: Never true  Transportation Needs: No Transportation Needs   Lack of Transportation (Medical): No   Lack of Transportation (Non-Medical): No  Physical Activity: Insufficiently Active   Days of Exercise per Week: 3 days   Minutes of Exercise per Session: 30 min  Stress: No Stress Concern Present   Feeling of Stress : Only a little  Social Connections: Socially Isolated   Frequency of Communication with Friends and Family: Three times a week   Frequency of Social Gatherings with Friends and Family: Three times a week   Attends Religious Services: Never   Active Member of Clubs or Organizations: No   Attends Archivist Meetings: Never   Marital Status: Widowed    Tobacco Counseling Counseling given: Not Answered   Clinical Intake:  Pre-visit preparation completed: Yes  Pain : No/denies pain     Nutritional Risks: None Diabetes: No  How often do you need to have someone help you when you read instructions, pamphlets, or other written materials from your doctor or pharmacy?: 1 - Never  Diabetic?  no  Interpreter Needed?: No  Information entered by :: Leroy Kennedy LPN   Activities of Daily Living In your present state of health, do you have any difficulty performing the following activities: 01/17/2021  Hearing? N  Vision? N  Difficulty concentrating or making decisions? N  Walking or climbing stairs? Y  Dressing or bathing? N  Doing errands, shopping? N  Preparing Food and eating ? N  Using the Toilet? N  In the past six months, have you accidently leaked urine? N  Do you have problems with loss of bowel control? N  Managing your Medications? N  Managing your Finances?  N  Housekeeping or managing your Housekeeping? N  Some recent data might be hidden    Patient Care Team: Maximiano Coss, NP as PCP - General (Adult Health Nurse Practitioner)  Indicate any recent Medical Services you may have received from other than [facility] providers in the past year (date may be approximate).     Assessment:   This is a routine wellness examination for Stepheny.  Hearing/Vision screen Hearing Screening - Comments:: No hearing aids No trouble hearing  Vision Screening - Comments:: Dr. Edwyna Shell Up to date  Dietary issues and exercise activities discussed: Current Exercise Habits: Home exercise routine, Type of exercise: walking, Time (Minutes): 30, Frequency (Times/Week): 3, Weekly Exercise (Minutes/Week): 90, Intensity: Mild   Goals Addressed             This Visit's Progress  Patient Stated       Loose weight       Depression Screen PHQ 2/9 Scores 01/17/2021 09/12/2020 09/12/2020 04/13/2020 03/08/2020 02/21/2020 02/21/2020  PHQ - 2 Score 2 0 0 0 0 0 0  PHQ- 9 Score 6 - - - - - -    Fall Risk Fall Risk  01/17/2021 09/12/2020 09/12/2020 04/13/2020 03/08/2020  Falls in the past year? 0 0 0 0 0  Number falls in past yr: 0 0 0 0 0  Injury with Fall? 0 0 0 0 0  Risk for fall due to : - - - - No Fall Risks  Follow up Falls evaluation completed;Falls prevention discussed;Education provided Falls evaluation completed Falls evaluation completed Falls evaluation completed Falls evaluation completed    FALL RISK PREVENTION PERTAINING TO THE HOME:  Any stairs in or around the home? Yes  If so, are there any without handrails? No  Home free of loose throw rugs in walkways, pet beds, electrical cords, etc? Yes  Adequate lighting in your home to reduce risk of falls? Yes   ASSISTIVE DEVICES UTILIZED TO PREVENT FALLS:  Life alert? No  Use of a cane, walker or w/c? No  Grab bars in the bathroom? No  Shower chair or bench in shower? No  Elevated toilet seat or a  handicapped toilet? No   TIMED UP AND GO:  Was the test performed? No .    Cognitive Function:  Normal cognitive status assessed by direct observation by this Nurse Health Advisor. No abnormalities found.          Immunizations Immunization History  Administered Date(s) Administered   [employer] Vaccination 02/12/2020   Tdap 03/06/2015    TDAP status: Up to date  Flu Vaccine status: Declined, Education has been provided regarding the importance of this vaccine but patient still declined. Advised may receive this vaccine at local pharmacy or Health Dept. Aware to provide a copy of the vaccination record if obtained from local pharmacy or Health Dept. Verbalized acceptance and understanding.  Pneumococcal vaccine status: Due, Education has been provided regarding the importance of this vaccine. Advised may receive this vaccine at local pharmacy or Health Dept. Aware to provide a copy of the vaccination record if obtained from local pharmacy or Health Dept. Verbalized acceptance and understanding.  Covid-19 vaccine status: Information provided on how to obtain vaccines.   Qualifies for Shingles Vaccine? No   Zostavax completed No   Shingrix Completed?: No.    Education has been provided regarding the importance of this vaccine. Patient has been advised to call insurance company to determine out of pocket expense if they have not yet received this vaccine. Advised may also receive vaccine at local pharmacy or Health Dept. Verbalized acceptance and understanding.  Screening Tests Health Maintenance  Topic Date Due   MAMMOGRAM  Never done   Zoster Vaccines- Shingrix (1 of 2) Never done   COVID-19 Vaccine (2 - Moderna series) 03/11/2020   INFLUENZA VACCINE  Never done   DEXA SCAN  09/12/2021 (Originally 07/03/2012)   PNA vac Low Risk Adult (1 of 2 - PCV13) 09/12/2021 (Originally 07/03/2012)   TETANUS/TDAP  03/05/2025   COLONOSCOPY (Pts 45-82yr Insurance coverage will need  to be confirmed)  08/07/2027   Hepatitis C Screening  Completed   HPV VACCINES  Aged Out    Health Maintenance  Health Maintenance Due  Topic Date Due   MAMMOGRAM  Never done   Zoster Vaccines- Shingrix (1 of 2)  Never done   COVID-19 Vaccine (2 - Moderna series) 03/11/2020   INFLUENZA VACCINE  Never done    Colorectal cancer screening: Type of screening: Colonoscopy. Completed  . Repeat every 5 years    Bone Density status: Ordered  . Pt provided with contact info and advised to call to schedule appt.  Lung Cancer Screening: (Low Dose CT Chest recommended if Age 74-80 years, 30 pack-year currently smoking OR have quit w/in 15years.) does not qualify.   Lung Cancer Screening Referral:   Additional Screening:  Hepatitis C Screening: does qualify;   Vision Screening: Recommended annual ophthalmology exams for early detection of glaucoma and other disorders of the eye. Is the patient up to date with their annual eye exam?  Yes  Who is the provider or what is the name of the office in which the patient attends annual eye exams? Dr. Clifton James If pt is not established with a provider, would they like to be referred to a provider to establish care? Yes .   Dental Screening: Recommended annual dental exams for proper oral hygiene  Community Resource Referral / Chronic Care Management: CRR required this visit?  No   CCM required this visit?  No      Plan:     I have personally reviewed and noted the following in the patient's chart:   Medical and social history Use of alcohol, tobacco or illicit drugs  Current medications and supplements including opioid prescriptions.  Functional ability and status Nutritional status Physical activity Advanced directives List of other physicians Hospitalizations, surgeries, and ER visits in previous 12 months Vitals Screenings to include cognitive, depression, and falls Referrals and appointments  In addition, I have reviewed and  discussed with patient certain preventive protocols, quality metrics, and best practice recommendations. A written personalized care plan for preventive services as well as general preventive health recommendations were provided to patient.     Leroy Kennedy, LPN   [phone]   Nurse Notes:

## 2021-01-24 ENCOUNTER — Other Ambulatory Visit: Payer: Self-pay | Admitting: Registered Nurse

## 2021-01-25 ENCOUNTER — Inpatient Hospital Stay: Payer: Medicare (Managed Care) | Admitting: Registered Nurse

## 2021-01-31 ENCOUNTER — Other Ambulatory Visit: Payer: Self-pay

## 2021-01-31 ENCOUNTER — Encounter: Payer: Self-pay | Admitting: Registered Nurse

## 2021-01-31 ENCOUNTER — Ambulatory Visit (INDEPENDENT_AMBULATORY_CARE_PROVIDER_SITE_OTHER): Payer: Medicare (Managed Care) | Admitting: Registered Nurse

## 2021-01-31 VITALS — BP 127/71 | HR 96 | Temp 98.0°F | Resp 18 | Ht 65.0 in | Wt 177.2 lb

## 2021-01-31 DIAGNOSIS — Z13220 Encounter for screening for lipoid disorders: Secondary | ICD-10-CM

## 2021-01-31 DIAGNOSIS — E559 Vitamin D deficiency, unspecified: Secondary | ICD-10-CM | POA: Diagnosis not present

## 2021-01-31 DIAGNOSIS — Z8639 Personal history of other endocrine, nutritional and metabolic disease: Secondary | ICD-10-CM | POA: Diagnosis not present

## 2021-01-31 DIAGNOSIS — F515 Nightmare disorder: Secondary | ICD-10-CM | POA: Diagnosis not present

## 2021-01-31 LAB — LIPID PANEL
Cholesterol: 210 mg/dL — ABNORMAL HIGH (ref 0–200)
HDL: 45.5 mg/dL (ref >39.00)
LDL Cholesterol: 137 mg/dL — ABNORMAL HIGH (ref 0–99)
NonHDL: 164.86
Total CHOL/HDL Ratio: 5
Triglycerides: 141 mg/dL (ref 0.0–149.0)
VLDL: 28.2 mg/dL (ref 0.0–40.0)

## 2021-01-31 LAB — HEMOGLOBIN A1C: Hgb A1c MFr Bld: 6 % (ref 4.6–6.5)

## 2021-01-31 LAB — VITAMIN D 25 HYDROXY (VIT D DEFICIENCY, FRACTURES): VITD: 23.13 ng/mL — ABNORMAL LOW (ref 30.00–100.00)

## 2021-01-31 MED ORDER — PRAZOSIN HCL 1 MG PO CAPS: 1.0000 mg | ORAL_CAPSULE | Freq: Every day | ORAL | 0 refills | Status: DC

## 2021-01-31 NOTE — Progress Notes (Signed)
Established Patient Office Visit  Subjective:  Patient ID: Susan Berg, female    DOB: 05/23/1945  Age: 75 y.o. MRN: [id number]  CC:  Chief Complaint  Patient presents with   Hospitalization Follow-up    Patient states she ishere because she was in the hospital and passed a kidney stone. Patient states she is feeling better.    HPI [name] presents for ER follow up and depression  ER visit RUQ/R lower chest wall pain Work up largely negative on review. It was suggested to her in ER she may have renal stone or gallstone, but no evidence of these found. Suspected to be mostly MSK pain, perhaps pleuritic pain. Cardiac and pulmonary work up negative.  She is feeling completely improved today. No ongoing concerns.  Depression Ongoing. Feels lonely often. Hx of trauma - lost her mother, father, and brother in a short period, losing her brother to violence. Nightmares most nights. Sleep schedule is poor - admits she likes sleeping in the day more than at night.  No hi/si  Past Medical History:  Diagnosis Date   High cholesterol    Hypertension    Vertigo    Vitamin D deficiency     Past Surgical History:  Procedure Laterality Date   ANKLE SURGERY      Family History  Problem Relation Age of Onset   Diabetes Mother    Rheum arthritis Mother    Stroke Mother    Cancer Father     Social History   Socioeconomic History   Marital status: Widowed    Spouse name: Not on file   Number of children: 2   Years of education: Not on file   Highest education level: Not on file  Occupational History   Not on file  Tobacco Use   Smoking status: Never   Smokeless tobacco: Never  Vaping Use   Vaping Use: Never used  Substance and Sexual Activity   Alcohol use: No   Drug use: No   Sexual activity: Not on file  Other Topics Concern   Not on file  Social History Narrative   Not on file   Social Determinants of Health   Financial Resource Strain: Low Risk     Difficulty of Paying Living Expenses: Not hard at all  Food Insecurity: No Food Insecurity   Worried About Running Out of Food in the Last Year: Never true   [place] in the Last Year: Never true  Transportation Needs: No Transportation Needs   Lack of Transportation (Medical): No   Lack of Transportation (Non-Medical): No  Physical Activity: Insufficiently Active   Days of Exercise per Week: 3 days   Minutes of Exercise per Session: 30 min  Stress: No Stress Concern Present   Feeling of Stress : Only a little  Social Connections: Socially Isolated   Frequency of Communication with Friends and Family: Three times a week   Frequency of Social Gatherings with Friends and Family: Three times a week   Attends Religious Services: Never   Active Member of Clubs or Organizations: No   Attends Archivist Meetings: Never   Marital Status: Widowed  Human resources officer Violence: Not At Risk   Fear of Current or Ex-Partner: No   Emotionally Abused: No   Physically Abused: No   Sexually Abused: No    Outpatient Medications Prior to Visit  Medication Sig Dispense Refill   amLODipine (NORVASC) 5 MG tablet TAKE 2 TABLETS BY  MOUTH EVERY DAY 180 tablet 0   azelastine (ASTELIN) 0.1 % nasal spray Place 1 spray into both nostrils 2 (two) times daily. Use in each nostril as directed 30 mL 12   benzonatate (TESSALON) 100 MG capsule Take 1 capsule (100 mg total) by mouth 2 (two) times daily as needed for cough. 20 capsule 0   benzonatate (TESSALON) 100 MG capsule Take 1 capsule (100 mg total) by mouth every 8 (eight) hours. 21 capsule 0   hydroxypropyl methylcellulose / hypromellose (ISOPTO TEARS / GONIOVISC) 2.5 % ophthalmic solution Place 1 drop into the right eye 4 (four) times daily as needed for dry eyes. 15 mL 12   losartan (COZAAR) 25 MG tablet TAKE 1 TABLET BY MOUTH EVERY DAY 90 tablet 1   meclizine (ANTIVERT) 25 MG tablet Take 1 tablet (25 mg total) by mouth 2 (two) times daily as  needed for dizziness. 60 tablet 1   pantoprazole (PROTONIX) 40 MG tablet TAKE 1 TABLET BY MOUTH EVERY DAY 90 tablet 1   simvastatin (ZOCOR) 40 MG tablet TAKE 1 TABLET BY MOUTH EVERY DAY 90 tablet 1   terbinafine (LAMISIL AT) 1 % cream Apply 1 application topically 2 (two) times daily. 42 g 3   terbinafine (LAMISIL) 250 MG tablet Take 1 tablet (250 mg total) by mouth daily. 84 tablet 0   triamcinolone cream (KENALOG) 0.1 % Apply 1 application topically 2 (two) times daily. 45 g 0   Vitamin D, Ergocalciferol, (DRISDOL) 1.25 MG (50000 UNIT) CAPS capsule Take 1 capsule (50,000 Units total) by mouth every 7 (seven) days. 6 capsule 0   No facility-administered medications prior to visit.    Allergies  Allergen Reactions   Codeine Nausea And Vomiting   Vytorin [Ezetimibe-Simvastatin] Hives   Lipitor [Atorvastatin] Rash    ROS Review of Systems  Constitutional: Negative.   HENT: Negative.    Eyes: Negative.   Respiratory: Negative.    Cardiovascular: Negative.   Gastrointestinal: Negative.   Genitourinary: Negative.   Musculoskeletal: Negative.   Skin: Negative.   Neurological: Negative.   Psychiatric/Behavioral:  Positive for dysphoric mood and sleep disturbance. Negative for agitation, behavioral problems, confusion, decreased concentration, hallucinations, self-injury and suicidal ideas. The patient is nervous/anxious. The patient is not hyperactive.   All other systems reviewed and are negative.    Objective:    Physical Exam Vitals and nursing note reviewed.  Constitutional:      General: She is not in acute distress.    Appearance: Normal appearance. She is normal weight. She is not ill-appearing, toxic-appearing or diaphoretic.  Cardiovascular:     Rate and Rhythm: Normal rate and regular rhythm.     Heart sounds: Normal heart sounds. No murmur heard.   No friction rub. No gallop.  Pulmonary:     Effort: Pulmonary effort is normal. No respiratory distress.     Breath  sounds: Normal breath sounds. No stridor. No wheezing, rhonchi or rales.  Chest:     Chest wall: No tenderness.  Skin:    General: Skin is warm and dry.  Neurological:     General: No focal deficit present.     Mental Status: She is alert and oriented to person, place, and time. Mental status is at baseline.  Psychiatric:        Mood and Affect: Mood normal.        Behavior: Behavior normal.        Thought Content: Thought content normal.  Judgment: Judgment normal.    BP 127/71   Pulse 96   Temp 98 F (36.7 C) (Temporal)   Resp 18   Ht 5\' 5"  (1.651 m)   Wt 177 lb 3.2 oz (80.4 kg)   SpO2 96%   BMI 29.49 kg/m  Wt Readings from Last 3 Encounters:  01/31/21 177 lb 3.2 oz (80.4 kg)  09/12/20 178 lb (80.7 kg)  04/13/20 171 lb 12.8 oz (77.9 kg)     Health Maintenance Due  Topic Date Due   MAMMOGRAM  Never done    There are no preventive care reminders to display for this patient.  Lab Results  Component Value Date   TSH 1.11 09/12/2020   Lab Results  Component Value Date   WBC 4.3 09/12/2020   HGB 11.9 (L) 09/12/2020   HCT 35.5 (L) 09/12/2020   MCV 91.0 09/12/2020   PLT 283.0 09/12/2020   Lab Results  Component Value Date   NA 141 09/12/2020   K 4.0 09/12/2020   CO2 29 09/12/2020   GLUCOSE 110 (H) 09/12/2020   BUN 13 09/12/2020   CREATININE 0.76 09/12/2020   BILITOT 1.0 09/12/2020   ALKPHOS 68 09/12/2020   AST 17 09/12/2020   ALT 17 09/12/2020   PROT 7.5 09/12/2020   ALBUMIN 4.5 09/12/2020   CALCIUM 9.6 09/12/2020   ANIONGAP 6 10/31/2015   GFR 77.86 09/12/2020   Lab Results  Component Value Date   CHOL 198 09/12/2020   Lab Results  Component Value Date   HDL 36.80 (L) 09/12/2020   Lab Results  Component Value Date   LDLCALC 143 (H) 09/12/2020   Lab Results  Component Value Date   TRIG 91.0 09/12/2020   Lab Results  Component Value Date   CHOLHDL 5 09/12/2020   Lab Results  Component Value Date   HGBA1C 6.0 09/12/2020       Assessment & Plan:   Problem List Items Addressed This Visit   None Visit Diagnoses     History of elevated glucose    -  Primary   Relevant Orders   Hemoglobin A1c   Lipid screening       Relevant Orders   Lipid panel   Vitamin D deficiency       Relevant Orders   Vitamin D (25 hydroxy)   Nightmares       Relevant Medications   prazosin (MINIPRESS) 1 MG capsule       Meds ordered this encounter  Medications   prazosin (MINIPRESS) 1 MG capsule    Sig: Take 1 capsule (1 mg total) by mouth at bedtime.    Dispense:  90 capsule    Refill:  0    Order Specific Question:   Supervising Provider    Answer:   Carlota Raspberry, JEFFREY R [[id number]]    Follow-up: No follow-ups on file.   PLAN Labs collected. Will follow up with the patient as warranted. Routine screening. ER follow up - no further action indicated. Refer to counseling. She has benefited from this in the past. Discussed prazosin for nightmares - she agrees this is good idea. Reviewed risks, benefits, and side effects. Will follow up in a few weeks Patient encouraged to call clinic with any questions, comments, or concerns.  Maximiano Coss, NP

## 2021-02-04 ENCOUNTER — Encounter: Payer: Self-pay | Admitting: Registered Nurse

## 2021-02-06 ENCOUNTER — Telehealth: Payer: Self-pay

## 2021-02-06 NOTE — Telephone Encounter (Signed)
Pt also wants a copy of her labs mailed to her as well.

## 2021-02-06 NOTE — Telephone Encounter (Signed)
Pt is calling about her lab work seeing if she can get the results of her lab work?   Pt call back [phone]

## 2021-02-07 NOTE — Telephone Encounter (Signed)
Called and spoke with patient about lab results. Patient understood. Patient labs have been printed and mailed to address on file.

## 2021-02-11 ENCOUNTER — Emergency Department (HOSPITAL_COMMUNITY)
Admission: EM | Admit: 2021-02-11 | Discharge: 2021-02-11 | Disposition: A | Discharge status: Home / Self Care | Payer: Medicare (Managed Care) | Attending: Emergency Medicine | Admitting: Emergency Medicine

## 2021-02-11 ENCOUNTER — Other Ambulatory Visit: Payer: Self-pay

## 2021-02-11 DIAGNOSIS — H5711 Ocular pain, right eye: Secondary | ICD-10-CM | POA: Diagnosis present

## 2021-02-11 DIAGNOSIS — H53141 Visual discomfort, right eye: Secondary | ICD-10-CM | POA: Diagnosis not present

## 2021-02-11 DIAGNOSIS — Z79899 Other long term (current) drug therapy: Secondary | ICD-10-CM | POA: Insufficient documentation

## 2021-02-11 DIAGNOSIS — I10 Essential (primary) hypertension: Secondary | ICD-10-CM | POA: Diagnosis not present

## 2021-02-11 MED ORDER — FLUORESCEIN SODIUM 1 MG OP STRP
1.0000 | ORAL_STRIP | Freq: Once | OPHTHALMIC | Status: AC
  Administered 2021-02-11: 1 via OPHTHALMIC
  Filled 2021-02-11: qty 1

## 2021-02-11 MED ORDER — TETRACAINE HCL 0.5 % OP SOLN
2.0000 [drp] | Freq: Once | OPHTHALMIC | Status: AC
  Administered 2021-02-11: 2 [drp] via OPHTHALMIC
  Filled 2021-02-11: qty 4

## 2021-02-11 NOTE — Discharge Instructions (Addendum)
Please follow up with your eye doctor as soon as possible.  Return to the emergency department if you develop severe eye pain, loss of vision or any other emergent concerns.

## 2021-02-11 NOTE — ED Provider Notes (Signed)
[facility] DEPT Provider Note   CSN: [id number] Arrival date & time: 02/11/21  1727     History Chief Complaint  Patient presents with   Eye Pain    Susan Berg is a 75 y.o. female who presents emergency department with a chief complaint of right eye discomfort.  Patient states that she has had some eye discomfort for 2 weeks.  She had some blurry vision which has resolved.  She feels like there is something moving in her eye.  She denies any current visual acuity changes, eye pain, discharge from the eye.  She does not wear corrective lenses except for reading glasses.  She has not seen her ophthalmologist since the [place] pandemic.  She denies severe headache.  She has noted some increased tearing and has been using refresh eyedrops at home with some relief of her discomfort.   Eye Pain Pertinent negatives include no headaches.     Past Medical History:  Diagnosis Date   High cholesterol    Hypertension    Vertigo    Vitamin D deficiency     Patient Active Problem List   Diagnosis Date Noted   Tinnitus of both ears 09/06/2019   Rash and nonspecific skin eruption 09/06/2019    Past Surgical History:  Procedure Laterality Date   ANKLE SURGERY       OB History   No obstetric history on file.     Family History  Problem Relation Age of Onset   Diabetes Mother    Rheum arthritis Mother    Stroke Mother    Cancer Father     Social History   Tobacco Use   Smoking status: Never   Smokeless tobacco: Never  Vaping Use   Vaping Use: Never used  Substance Use Topics   Alcohol use: No   Drug use: No    Home Medications Prior to Admission medications   Medication Sig Start Date End Date Taking? Authorizing Provider  amLODipine (NORVASC) 5 MG tablet TAKE 2 TABLETS BY MOUTH EVERY DAY 09/28/20   Maximiano Coss, NP  azelastine (ASTELIN) 0.1 % nasal spray Place 1 spray into both nostrils 2 (two) times daily. Use in each nostril as  directed 09/12/20   Maximiano Coss, NP  benzonatate (TESSALON) 100 MG capsule Take 1 capsule (100 mg total) by mouth 2 (two) times daily as needed for cough. 09/16/19   Maximiano Coss, NP  benzonatate (TESSALON) 100 MG capsule Take 1 capsule (100 mg total) by mouth every 8 (eight) hours. 09/21/19   Alroy Bailiff, Margaux, PA-C  hydroxypropyl methylcellulose / hypromellose (ISOPTO TEARS / GONIOVISC) 2.5 % ophthalmic solution Place 1 drop into the right eye 4 (four) times daily as needed for dry eyes. 11/08/19   Darr, Edison Nasuti, PA-C  losartan (COZAAR) 25 MG tablet TAKE 1 TABLET BY MOUTH EVERY DAY 01/25/21   Maximiano Coss, NP  meclizine (ANTIVERT) 25 MG tablet Take 1 tablet (25 mg total) by mouth 2 (two) times daily as needed for dizziness. 08/12/19   Raylene Everts, MD  pantoprazole (PROTONIX) 40 MG tablet TAKE 1 TABLET BY MOUTH EVERY DAY 05/30/20   Maximiano Coss, NP  prazosin (MINIPRESS) 1 MG capsule Take 1 capsule (1 mg total) by mouth at bedtime. 01/31/21   Maximiano Coss, NP  simvastatin (ZOCOR) 40 MG tablet TAKE 1 TABLET BY MOUTH EVERY DAY 10/18/20   Maximiano Coss, NP  terbinafine (LAMISIL AT) 1 % cream Apply 1 application topically 2 (two) times daily. 04/13/20  Maximiano Coss, NP  terbinafine (LAMISIL) 250 MG tablet Take 1 tablet (250 mg total) by mouth daily. 04/13/20   Maximiano Coss, NP  triamcinolone cream (KENALOG) 0.1 % Apply 1 application topically 2 (two) times daily. 02/21/20   Maximiano Coss, NP  Vitamin D, Ergocalciferol, (DRISDOL) 1.25 MG (50000 UNIT) CAPS capsule Take 1 capsule (50,000 Units total) by mouth every 7 (seven) days. 09/08/19   Maximiano Coss, NP  fexofenadine (ALLEGRA) 180 MG tablet Take 1 tablet (180 mg total) by mouth daily. Patient taking differently: Take 180 mg by mouth daily as needed for allergies.  08/22/15 04/15/19  Billy Fischer, MD  fluticasone (FLONASE) 50 MCG/ACT nasal spray Place 1 spray into both nostrils 2 (two) times daily. Patient taking differently: Place 1  spray into both nostrils daily as needed for allergies.  08/22/15 09/15/19  Billy Fischer, MD    Allergies    Codeine, Vytorin [ezetimibe-simvastatin], and Lipitor [atorvastatin]  Review of Systems   Review of Systems  Eyes:  Negative for photophobia, pain, discharge, redness, itching and visual disturbance.  Neurological:  Negative for headaches.   Physical Exam Updated Vital Signs BP 125/76 (BP Location: Right Arm)   Pulse 94   Temp 98.8 F (37.1 C) (Oral)   Resp 18   Ht 5\' 5"  (1.651 m)   Wt 80.7 kg   SpO2 100%   BMI 29.62 kg/m   Physical Exam Vitals and nursing note reviewed.  Constitutional:      General: She is not in acute distress.    Appearance: She is well-developed. She is not diaphoretic.  HENT:     Head: Normocephalic and atraumatic.     Right Ear: External ear normal.     Left Ear: External ear normal.     Nose: Nose normal.     Mouth/Throat:     Mouth: Mucous membranes are moist.  Eyes:     General: Lids are normal. Lids are everted, no foreign bodies appreciated. Vision grossly intact. Gaze aligned appropriately. No scleral icterus.       Right eye: No foreign body, discharge or hordeolum.        Left eye: No foreign body, discharge or hordeolum.     Intraocular pressure: Right eye pressure is 20 mmHg. Left eye pressure is 15 mmHg.     Extraocular Movements:     Right eye: Normal extraocular motion and no nystagmus.     Left eye: Normal extraocular motion and no nystagmus.     Conjunctiva/sclera: Conjunctivae normal.     Pupils: Pupils are equal, round, and reactive to light.     Right eye: No corneal abrasion or fluorescein uptake. Seidel exam negative.     Funduscopic exam:    Right eye: Red reflex present.        Left eye: Red reflex present.    Comments: Bilateral Distance: 20/40 R Distance: 20/50 L Distance: 20/40 No photophobia  Cardiovascular:     Rate and Rhythm: Normal rate and regular rhythm.     Heart sounds: Normal heart sounds. No murmur  heard.   No friction rub. No gallop.  Pulmonary:     Effort: Pulmonary effort is normal. No respiratory distress.     Breath sounds: Normal breath sounds.  Abdominal:     General: Bowel sounds are normal. There is no distension.     Palpations: Abdomen is soft. There is no mass.     Tenderness: There is no abdominal tenderness. There is no guarding.  Musculoskeletal:     Cervical back: Normal range of motion.  Skin:    General: Skin is warm and dry.  Neurological:     Mental Status: She is alert and oriented to person, place, and time.  Psychiatric:        Behavior: Behavior normal.    ED Results / Procedures / Treatments   Labs (all labs ordered are listed, but only abnormal results are displayed) Labs Reviewed - No data to display  EKG None  Radiology No results found.  Procedures Procedures   Medications Ordered in ED Medications  fluorescein ophthalmic strip 1 strip (1 strip Both Eyes Given by Other 02/11/21 1858)  tetracaine (PONTOCAINE) 0.5 % ophthalmic solution 2 drop (2 drops Both Eyes Given by Other 02/11/21 1858)    ED Course  I have reviewed the triage vital signs and the nursing notes.  Pertinent labs & imaging results that were available during my care of the patient were reviewed by me and considered in my medical decision making (see chart for details).    MDM Rules/Calculators/A&P                           Patient with eye discomfort, no abnormalities seen or palpated. Conjunctive a is normal, no evidence of corneal abrasion or perforated globe.  Patient has reactive pupil without conjunctival irritation no evidence of chronic or acute angle-closure glaucoma.  There are no foreign bodies present.  Vision is grossly intact.  Does not appear to have any evidence of emergent eye discomfort.  Patient advised to follow closely with her ophthalmologist and has a scheduled appointment at this time.  She appears otherwise appropriate for discharge. Final  Clinical Impression(s) / ED Diagnoses Final diagnoses:  Discomfort of right eye    Rx / DC Orders ED Discharge Orders     None        Margarita Mail, PA-C 02/12/21 [place], [place], DO 02/12/21 1647

## 2021-02-11 NOTE — ED Triage Notes (Signed)
Pt came from home via POV. C/c: right eye pain x 2 weeks. Pt attempted to get into see eye doctor but they were too busy and told pt to come to ED. Pt states "It doesn't feel like theres something in my eye, but it feels like there is something moving back there. Now my eye just feels funny." Pt denies any abnormal drainage or redness in right eye. Pt used refresh eye drops at home with some relief

## 2021-03-12 ENCOUNTER — Ambulatory Visit: Payer: Medicare (Managed Care) | Admitting: Registered Nurse

## 2021-03-21 ENCOUNTER — Emergency Department (HOSPITAL_COMMUNITY): Payer: Medicare (Managed Care)

## 2021-03-21 ENCOUNTER — Encounter (HOSPITAL_COMMUNITY): Payer: Self-pay | Admitting: Oncology

## 2021-03-21 ENCOUNTER — Other Ambulatory Visit: Payer: Self-pay

## 2021-03-21 ENCOUNTER — Emergency Department (HOSPITAL_COMMUNITY)
Admission: EM | Admit: 2021-03-21 | Discharge: 2021-03-21 | Disposition: A | Discharge status: Home / Self Care | Payer: Medicare (Managed Care) | Attending: Emergency Medicine | Admitting: Emergency Medicine

## 2021-03-21 DIAGNOSIS — R109 Unspecified abdominal pain: Secondary | ICD-10-CM | POA: Diagnosis present

## 2021-03-21 DIAGNOSIS — I10 Essential (primary) hypertension: Secondary | ICD-10-CM | POA: Diagnosis not present

## 2021-03-21 DIAGNOSIS — Z79899 Other long term (current) drug therapy: Secondary | ICD-10-CM | POA: Insufficient documentation

## 2021-03-21 DIAGNOSIS — R911 Solitary pulmonary nodule: Secondary | ICD-10-CM | POA: Diagnosis not present

## 2021-03-21 DIAGNOSIS — N12 Tubulo-interstitial nephritis, not specified as acute or chronic: Secondary | ICD-10-CM | POA: Diagnosis not present

## 2021-03-21 LAB — CBC WITH DIFFERENTIAL/PLATELET
Abs Immature Granulocytes: 0.01 10*3/uL (ref 0.00–0.07)
Basophils Absolute: 0 10*3/uL (ref 0.0–0.1)
Basophils Relative: 0 %
Eosinophils Absolute: 0.2 10*3/uL (ref 0.0–0.5)
Eosinophils Relative: 2 %
HCT: 39.3 % (ref 36.0–46.0)
Hemoglobin: 12.8 g/dL (ref 12.0–15.0)
Immature Granulocytes: 0 %
Lymphocytes Relative: 17 %
Lymphs Abs: 1.2 10*3/uL (ref 0.7–4.0)
MCH: 30.5 pg (ref 26.0–34.0)
MCHC: 32.6 g/dL (ref 30.0–36.0)
MCV: 93.8 fL (ref 80.0–100.0)
Monocytes Absolute: 0.4 10*3/uL (ref 0.1–1.0)
Monocytes Relative: 5 %
Neutro Abs: 5.6 10*3/uL (ref 1.7–7.7)
Neutrophils Relative %: 76 %
Platelets: 239 10*3/uL (ref 150–400)
RBC: 4.19 MIL/uL (ref 3.87–5.11)
RDW: 12.4 % (ref 11.5–15.5)
WBC: 7.3 10*3/uL (ref 4.0–10.5)
nRBC: 0 % (ref 0.0–0.2)

## 2021-03-21 LAB — URINALYSIS, ROUTINE W REFLEX MICROSCOPIC
Bilirubin Urine: NEGATIVE
Glucose, UA: NEGATIVE mg/dL
Ketones, ur: NEGATIVE mg/dL
Nitrite: POSITIVE — AB
Protein, ur: 100 mg/dL — AB
Specific Gravity, Urine: 1.02 (ref 1.005–1.030)
WBC, UA: >50 WBC/hpf — ABNORMAL HIGH (ref 0–5)
pH: 5 (ref 5.0–8.0)

## 2021-03-21 LAB — COMPREHENSIVE METABOLIC PANEL
ALT: 21 U/L (ref 0–44)
AST: 20 U/L (ref 15–41)
Albumin: 4.4 g/dL (ref 3.5–5.0)
Alkaline Phosphatase: 57 U/L (ref 38–126)
Anion gap: 9 (ref 5–15)
BUN: 14 mg/dL (ref 8–23)
CO2: 26 mmol/L (ref 22–32)
Calcium: 9.5 mg/dL (ref 8.9–10.3)
Chloride: 104 mmol/L (ref 98–111)
Creatinine, Ser: 0.8 mg/dL (ref 0.44–1.00)
GFR, Estimated: >60 mL/min (ref >60)
Glucose, Bld: 123 mg/dL — ABNORMAL HIGH (ref 70–99)
Potassium: 3.5 mmol/L (ref 3.5–5.1)
Sodium: 139 mmol/L (ref 135–145)
Total Bilirubin: 2.1 mg/dL — ABNORMAL HIGH (ref 0.3–1.2)
Total Protein: 8.6 g/dL — ABNORMAL HIGH (ref 6.5–8.1)

## 2021-03-21 LAB — LIPASE, BLOOD: Lipase: 30 U/L (ref 11–51)

## 2021-03-21 MED ORDER — CEPHALEXIN 500 MG PO CAPS: 500.0000 mg | ORAL_CAPSULE | Freq: Four times a day (QID) | ORAL | 0 refills | Status: DC

## 2021-03-21 MED ORDER — HYDROCODONE-ACETAMINOPHEN 5-325 MG PO TABS: 1.0000 | ORAL_TABLET | Freq: Four times a day (QID) | ORAL | 0 refills | Status: DC | PRN

## 2021-03-21 MED ORDER — HYDROCODONE-ACETAMINOPHEN 5-325 MG PO TABS
1.0000 | ORAL_TABLET | Freq: Once | ORAL | Status: DC
Start: 2021-03-21 — End: 2021-03-21

## 2021-03-21 NOTE — ED Triage Notes (Signed)
Pt reports left flank pain that began yesterday.  Pt has hx of kidney stone 8/22.  Denies any other urinary sx.

## 2021-03-21 NOTE — Discharge Instructions (Addendum)
Watch for signs of worsening infection.  Return if symptoms worsen There are lung nodules on her CAT scan.  A repeat CAT scan in 3 to 6 months is indicated.  Can be followed up by her primary care doctor

## 2021-03-21 NOTE — ED Provider Notes (Signed)
Emergency Medicine Provider Triage Evaluation Note  Susan Berg , a 75 y.o. female  was evaluated in triage.  Pt complains of left flank pain and left abd pain that started suddenly. She further reports nausea and dry heaving yesterday.  Review of Systems  Positive: Flank pain, abd pain, nausea Negative: Diarrhea, fever  Physical Exam  BP (!) 146/85 (BP Location: Left Arm)   Pulse (!) 120   Temp 98 F (36.7 C) (Oral)   Resp 18   Ht 5\' 5"  (1.651 m)   Wt 79.8 kg   SpO2 96%   BMI 29.29 kg/m  Gen:   Awake, no distress   Resp:  Normal effort  MSK:   Moves extremities without difficulty  Other:  Left cva ttp, left lower abd ttp  Medical Decision Making  Medically screening exam initiated at 12:24 PM.  Appropriate orders placed.  ATHENE SCHUHMACHER was informed that the remainder of the evaluation will be completed by another provider, this initial triage assessment does not replace that evaluation, and the importance of remaining in the ED until their evaluation is complete.     Bishop Dublin 03/21/21 1224    Davonna Belling, MD 03/22/21 [phone]

## 2021-03-21 NOTE — ED Notes (Signed)
An After Visit Summary was printed and given to the patient. Discharge instructions given and no further questions at this time.  

## 2021-03-21 NOTE — ED Provider Notes (Signed)
[place] DEPT Provider Note   CSN: [id number] Arrival date & time: 03/21/21  1143     History Chief Complaint  Patient presents with   Flank Pain    Susan Berg is a 75 y.o. female.   Flank Pain Pertinent negatives include no chest pain, no abdominal pain and no shortness of breath. Patient presents with left flank pain.  Began around 12 hours ago.  States it feels like a kidney stone that she had back 3 months ago.  States she was in [facility] for that.  No dysuria.  Has had some nausea and vomited once since it started.  No fevers or chills.  States he is feeling little better right now.  No diarrhea or constipation.  Pain is sharp.  Is worse with certain movements however.     Past Medical History:  Diagnosis Date   High cholesterol    Hypertension    Vertigo    Vitamin D deficiency     Patient Active Problem List   Diagnosis Date Noted   Tinnitus of both ears 09/06/2019   Rash and nonspecific skin eruption 09/06/2019    Past Surgical History:  Procedure Laterality Date   ANKLE SURGERY       OB History   No obstetric history on file.     Family History  Problem Relation Age of Onset   Diabetes Mother    Rheum arthritis Mother    Stroke Mother    Cancer Father     Social History   Tobacco Use   Smoking status: Never   Smokeless tobacco: Never  Vaping Use   Vaping Use: Never used  Substance Use Topics   Alcohol use: No   Drug use: No    Home Medications Prior to Admission medications   Medication Sig Start Date End Date Taking? Authorizing Provider  amLODipine (NORVASC) 5 MG tablet TAKE 2 TABLETS BY MOUTH EVERY DAY 09/28/20  Yes Maximiano Coss, NP  cholecalciferol (VITAMIN D3) 25 MCG (1000 UNIT) tablet Take 1,000 Units by mouth in the morning and at bedtime.   Yes [provider]  HYDROcodone-acetaminophen (NORCO/VICODIN) 5-325 MG tablet Take 1-2 tablets by mouth every 6 (six) hours as needed.  03/21/21  Yes Davonna Belling, MD  losartan (COZAAR) 25 MG tablet TAKE 1 TABLET BY MOUTH EVERY DAY 01/25/21  Yes Maximiano Coss, NP  meclizine (ANTIVERT) 25 MG tablet Take 1 tablet (25 mg total) by mouth 2 (two) times daily as needed for dizziness. 08/12/19  Yes Raylene Everts, MD  prazosin (MINIPRESS) 1 MG capsule Take 1 capsule (1 mg total) by mouth at bedtime. Patient taking differently: Take 1 mg by mouth at bedtime as needed. 01/31/21  Yes Maximiano Coss, NP  simvastatin (ZOCOR) 40 MG tablet TAKE 1 TABLET BY MOUTH EVERY DAY 10/18/20  Yes Maximiano Coss, NP  azelastine (ASTELIN) 0.1 % nasal spray Place 1 spray into both nostrils 2 (two) times daily. Use in each nostril as directed Patient not taking: Reported on 03/21/2021 09/12/20   Maximiano Coss, NP  benzonatate (TESSALON) 100 MG capsule Take 1 capsule (100 mg total) by mouth 2 (two) times daily as needed for cough. Patient not taking: Reported on 03/21/2021 09/16/19   Maximiano Coss, NP  benzonatate (TESSALON) 100 MG capsule Take 1 capsule (100 mg total) by mouth every 8 (eight) hours. Patient not taking: Reported on 03/21/2021 09/21/19   Eustaquio Maize, PA-C  cephALEXin (KEFLEX) 500 MG capsule Take 1 capsule (  500 mg total) by mouth 4 (four) times daily. 03/21/21   Davonna Belling, MD  hydroxypropyl methylcellulose / hypromellose (ISOPTO TEARS / GONIOVISC) 2.5 % ophthalmic solution Place 1 drop into the right eye 4 (four) times daily as needed for dry eyes. Patient not taking: Reported on 03/21/2021 11/08/19   Darr, Edison Nasuti, PA-C  pantoprazole (PROTONIX) 40 MG tablet TAKE 1 TABLET BY MOUTH EVERY DAY Patient not taking: Reported on 03/21/2021 05/30/20   Maximiano Coss, NP  terbinafine (LAMISIL AT) 1 % cream Apply 1 application topically 2 (two) times daily. Patient not taking: Reported on 03/21/2021 04/13/20   Maximiano Coss, NP  terbinafine (LAMISIL) 250 MG tablet Take 1 tablet (250 mg total) by mouth daily. Patient not taking: Reported  on 03/21/2021 04/13/20   Maximiano Coss, NP  triamcinolone cream (KENALOG) 0.1 % Apply 1 application topically 2 (two) times daily. Patient not taking: Reported on 03/21/2021 02/21/20   Maximiano Coss, NP  Vitamin D, Ergocalciferol, (DRISDOL) 1.25 MG (50000 UNIT) CAPS capsule Take 1 capsule (50,000 Units total) by mouth every 7 (seven) days. Patient not taking: Reported on 03/21/2021 09/08/19   Maximiano Coss, NP  fexofenadine (ALLEGRA) 180 MG tablet Take 1 tablet (180 mg total) by mouth daily. Patient taking differently: Take 180 mg by mouth daily as needed for allergies.  08/22/15 04/15/19  Billy Fischer, MD  fluticasone (FLONASE) 50 MCG/ACT nasal spray Place 1 spray into both nostrils 2 (two) times daily. Patient taking differently: Place 1 spray into both nostrils daily as needed for allergies.  08/22/15 09/15/19  Billy Fischer, MD    Allergies    Codeine, Vytorin [ezetimibe-simvastatin], and Lipitor [atorvastatin]  Review of Systems   Review of Systems  Constitutional:  Negative for appetite change.  HENT:  Negative for congestion.   Respiratory:  Negative for shortness of breath.   Cardiovascular:  Negative for chest pain.  Gastrointestinal:  Positive for nausea and vomiting. Negative for abdominal pain.  Genitourinary:  Positive for flank pain.  Musculoskeletal:  Negative for back pain.  Skin:  Negative for rash.  Neurological:  Negative for weakness.  Psychiatric/Behavioral:  Negative for confusion.    Physical Exam Updated Vital Signs BP 127/77   Pulse 88   Temp 98 F (36.7 C) (Oral)   Resp 18   Ht 5\' 5"  (1.651 m)   Wt 79.8 kg   SpO2 99%   BMI 29.29 kg/m   Physical Exam Vitals and nursing note reviewed.  HENT:     Head: Atraumatic.     Mouth/Throat:     Mouth: Mucous membranes are moist.  Eyes:     Pupils: Pupils are equal, round, and reactive to light.  Cardiovascular:     Rate and Rhythm: Regular rhythm.  Pulmonary:     Breath sounds: No wheezing or  rhonchi.  Abdominal:     Tenderness: There is abdominal tenderness.     Comments: Mild left lower quadrant tenderness without rebound or guarding.  No hernias palpated.  Genitourinary:    Comments: Some CVA and left lower back tenderness on left side.  No rash.  No deformity. Musculoskeletal:     Cervical back: Neck supple.  Skin:    General: Skin is warm.     Capillary Refill: Capillary refill takes less than 2 seconds.  Neurological:     Mental Status: She is alert and oriented to person, place, and time.  Psychiatric:        Mood and Affect: Mood normal.  ED Results / Procedures / Treatments   Labs (all labs ordered are listed, but only abnormal results are displayed) Labs Reviewed  URINALYSIS, ROUTINE W REFLEX MICROSCOPIC - Abnormal; Notable for the following components:      Result Value   APPearance CLOUDY (*)    Hgb urine dipstick MODERATE (*)    Protein, ur 100 (*)    Nitrite POSITIVE (*)    Leukocytes,Ua LARGE (*)    WBC, UA >50 (*)    Bacteria, UA MANY (*)    All other components within normal limits  COMPREHENSIVE METABOLIC PANEL - Abnormal; Notable for the following components:   Glucose, Bld 123 (*)    Total Protein 8.6 (*)    Total Bilirubin 2.1 (*)    All other components within normal limits  URINE CULTURE  CBC WITH DIFFERENTIAL/PLATELET  LIPASE, BLOOD    EKG None  Radiology CT Renal Stone Study  Result Date: 03/21/2021 CLINICAL DATA:  A 76 year old female presents with LEFT-sided flank and pelvic pain for 3 days. EXAM: CT ABDOMEN AND PELVIS WITHOUT CONTRAST TECHNIQUE: Multidetector CT imaging of the abdomen and pelvis was performed following the standard protocol without IV contrast. COMPARISON:  None FINDINGS: Lower chest: Small pulmonary nodule in the RIGHT lower lobe (image 3/2) 6 mm. Adjacent subtle nodule approximately 5 mm on image (5/2). No effusion. No consolidative changes. Hepatobiliary: Smooth hepatic contours. No signs of pericholecystic  stranding or gross biliary duct distension. Pancreas: Pancreas without signs of inflammation. Smooth contour the pancreas. Spleen: Normal. Adrenals/Urinary Tract: RIGHT lower pole with small renal cyst measuring 1.3 cm. Small presumed cyst along the anterior cortex LEFT kidney measuring slightly less than a cm with water density in homogeneous appearance. Subtle peripelvic stranding on the LEFT Peri ureteral stranding also on the LEFT. The ureter is mildly patulous as it tracks behind a dense diverticulum at the sacral promontory. Question of tiny calculus midportion of the LEFT ureter at the junction of middle and distal thirds. This is not clear. There is no visible calculus beyond this location or ureteral dilation beyond this location. No peripelvic stranding or perinephric stranding on the RIGHT. There is no hydronephrosis in either kidney at this time. No nephrolithiasis is visible within either kidney. Urinary bladder is collapsed without visible calcium. Stomach/Bowel: Hiatal hernia. No perigastric stranding. No acute bowel process. Colonic diverticulosis. Normal appendix. Diverticular changes are seen throughout the entire colon. Redundant sigmoid without signs of obstruction. In the area of the proximal sigmoid there is a diverticulum which is immediately adjacent to the site of mild ureteral stranding and potential very minimal ureteral transition but there is no surrounding inflammation Vascular/Lymphatic: Aortic atherosclerosis. No sign of aneurysm. Smooth contour of the IVC. There is no gastrohepatic or hepatoduodenal ligament lymphadenopathy. No retroperitoneal or mesenteric lymphadenopathy. No pelvic sidewall lymphadenopathy. Limited assessment of vascular structures due to lack of intravenous contrast. Reproductive: Unremarkable by CT. Other: No ascites.  No free air. Musculoskeletal: No acute bone finding. No destructive bone process. Spinal degenerative changes. IMPRESSION: Peri ureteral and  peripelvic stranding on the LEFT with mild fullness of the LEFT ureter, no current signs of hydronephrosis. Findings could be seen in the setting of urinary tract infection or recently passed calculus. No definitive signs of ureteral calculus but very subtle calcification may be present in the distal aspect of the middle third of the LEFT ureter. Dense adjacent diverticulum limits assessment in this area. Correlate with urine studies. If there is persistent hematuria on follow-up could consider  dedicated hematuria evaluation for further assessment. Colonic diverticulosis without current signs of inflammation. Hiatal hernia. Small pulmonary nodules in the RIGHT lower lobe. Largest is approximately 6 mm. Non-contrast chest CT at 3-6 months is recommended. If the nodules are stable at time of repeat CT, then future CT at 18-24 months (from today's scan) is considered optional for low-risk patients, but is recommended for high-risk patients. This recommendation follows the consensus statement: Guidelines for Management of Incidental Pulmonary Nodules Detected on CT Images: From the Fleischner Society 2017; Radiology 2017; 284:228-243. Electronically Signed   By: Zetta Bills M.D.   On: 03/21/2021 15:00    Procedures Procedures   Medications Ordered in ED Medications - No data to display  ED Course  I have reviewed the triage vital signs and the nursing notes.  Pertinent labs & imaging results that were available during my care of the patient were reviewed by me and considered in my medical decision making (see chart for details).    MDM Rules/Calculators/A&P                           Patient with left-sided flank pain.  Has had previous kidney stones reported.  Although states she was told she passed a stone.  Do not have access to the scan from this time.  Has some mild swelling on the left side but not frank hydronephrosis.  Possible tiny kidney stone but not sure.  Lab work reassuring.  Urine  showed potential infection.  Has had some nausea or vomiting.  At this point I think it is worth treating as a UTI.  I do not think is infected obstructed stone but more instructions given to patient.  Also given some pain medicines.  Follow-up with urology due to the possible stone.  Will discharge home. Final Clinical Impression(s) / ED Diagnoses Final diagnoses:  Pyelonephritis  Lung nodule    Rx / DC Orders ED Discharge Orders          Ordered    cephALEXin (KEFLEX) 500 MG capsule  4 times daily,   Status:  Discontinued        03/21/21 1614    HYDROcodone-acetaminophen (NORCO/VICODIN) 5-325 MG tablet  Every 6 hours PRN        03/21/21 1632    cephALEXin (KEFLEX) 500 MG capsule  4 times daily        03/21/21 1632             Davonna Belling, MD 03/22/21 [phone]

## 2021-03-23 LAB — URINE CULTURE: Culture: >=100000 — AB

## 2021-03-24 ENCOUNTER — Telehealth: Payer: Self-pay | Admitting: Emergency Medicine

## 2021-03-24 NOTE — Progress Notes (Signed)
ED Antimicrobial Stewardship Positive Culture Follow Up   Susan Berg is an 75 y.o. female who presented to [facility] on 03/21/2021 with a chief complaint of  Chief Complaint  Patient presents with   Flank Pain    Recent Results (from the past 720 hour(s))  Urine Culture     Status: Abnormal   Collection Time: 03/21/21 12:16 PM   Specimen: Urine, Clean Catch  Result Value Ref Range Status   Specimen Description   Final    URINE, CLEAN CATCH Performed at [facility], [place] [address] [place], [place] [address]    Special Requests   Final    NONE Performed at [facility], [place] [address], [place], [place] [address]    Culture >=100,000 COLONIES/mL ENTEROBACTER AEROGENES (A)  Final   Report Status 03/23/2021 FINAL  Final   Organism ID, Bacteria ENTEROBACTER AEROGENES (A)  Final      Susceptibility   Enterobacter aerogenes - MIC*    CEFAZOLIN >=64 RESISTANT Resistant     CEFEPIME <=0.12 SENSITIVE Sensitive     CEFTRIAXONE <=0.25 SENSITIVE Sensitive     CIPROFLOXACIN <=0.25 SENSITIVE Sensitive     GENTAMICIN <=1 SENSITIVE Sensitive     IMIPENEM 0.5 SENSITIVE Sensitive     NITROFURANTOIN 64 INTERMEDIATE Intermediate     TRIMETH/SULFA <=20 SENSITIVE Sensitive     PIP/TAZO <=4 SENSITIVE Sensitive     * >=100,000 COLONIES/mL ENTEROBACTER AEROGENES    [x]  Treated with Keflex, organism resistant to prescribed antimicrobial Unclear if true UTI or stone.  Plan: Stop Keflex Call patient for symptom check: If symptoms resolved, no further treatment needed If any persistent flank pain, n/v, or new signs of UTI (fevers, dysuria, etc) - start Cipro 500 mg PO bid x 7 days (#14) and f/u with Urology  ED Provider: Blanchie Dessert, MD   [facility], [name] A 03/24/2021, 9:45 AM Clinical Pharmacist [phone]

## 2021-03-24 NOTE — Telephone Encounter (Signed)
Post ED Visit - Positive Culture Follow-up: Unsuccessful Patient Follow-up  Culture assessed and recommendations reviewed by:  []  Elenor Quinones, Pharm.D. []  Heide Guile, Pharm.D., BCPS AQ-ID []  Parks Neptune, Pharm.D., BCPS []  Alycia Rossetti, Pharm.D., BCPS []  [place], Pharm.D., BCPS, AAHIVP []  Legrand Como, Pharm.D., BCPS, AAHIVP [x]  Reuel Boom, PharmD []  Vincenza Hews, PharmD, BCPS  Positive urine culture  []  Patient discharged without antimicrobial prescription and treatment is now indicated []  Organism is resistant to prescribed ED discharge antimicrobial []  Patient with positive blood cultures   Unable to contact patient at phone on file, letter will be sent to address on file Plan: Symptom check, if symptomatic start Cipro 500 mg BID for seven days - Blanchie Dessert, MD  Milus Mallick 03/24/2021, 6:12 PM

## 2021-03-25 ENCOUNTER — Ambulatory Visit: Payer: Medicare (Managed Care) | Admitting: Podiatry

## 2021-04-01 ENCOUNTER — Ambulatory Visit (INDEPENDENT_AMBULATORY_CARE_PROVIDER_SITE_OTHER): Payer: Medicare (Managed Care) | Admitting: Podiatry

## 2021-04-01 ENCOUNTER — Other Ambulatory Visit: Payer: Self-pay

## 2021-04-01 DIAGNOSIS — M79674 Pain in right toe(s): Secondary | ICD-10-CM

## 2021-04-01 DIAGNOSIS — B351 Tinea unguium: Secondary | ICD-10-CM

## 2021-04-01 DIAGNOSIS — M79675 Pain in left toe(s): Secondary | ICD-10-CM

## 2021-04-01 NOTE — Progress Notes (Signed)
   SUBJECTIVE Patient presents to office today complaining of elongated, thickened nails that cause pain while ambulating in shoes.  Patient is unable to trim their own nails. Patient is here for further evaluation and treatment.  Past Medical History:  Diagnosis Date   High cholesterol    Hypertension    Vertigo    Vitamin D deficiency     OBJECTIVE General Patient is awake, alert, and oriented x 3 and in no acute distress. Derm Skin is dry and supple bilateral. Negative open lesions or macerations. Remaining integument unremarkable. Nails are tender, long, thickened and dystrophic with subungual debris, consistent with onychomycosis, 1-5 bilateral. No signs of infection noted. Vasc  DP and PT pedal pulses palpable bilaterally. Temperature gradient within normal limits.  Neuro Epicritic and protective threshold sensation grossly intact bilaterally.  Musculoskeletal Exam No symptomatic pedal deformities noted bilateral. Muscular strength within normal limits.  ASSESSMENT 1.  Pain due to onychomycosis of toenails both  PLAN OF CARE 1. Patient evaluated today.  2. Instructed to maintain good pedal hygiene and foot care.  3. Mechanical debridement of nails 1-5 bilaterally performed using a nail nipper. Filed with dremel without incident.  4. Return to clinic in 3 mos.    Edrick Kins, DPM [facility]  Dr. Edrick Kins, DPM    [address], [place] [address]                Office [phone]  Fax [phone]

## 2021-04-20 ENCOUNTER — Other Ambulatory Visit: Payer: Self-pay | Admitting: Registered Nurse

## 2021-04-20 DIAGNOSIS — Z8639 Personal history of other endocrine, nutritional and metabolic disease: Secondary | ICD-10-CM

## 2021-05-03 ENCOUNTER — Other Ambulatory Visit: Payer: Self-pay | Admitting: Registered Nurse

## 2021-05-09 ENCOUNTER — Other Ambulatory Visit: Payer: Self-pay | Admitting: Registered Nurse

## 2021-05-09 DIAGNOSIS — F515 Nightmare disorder: Secondary | ICD-10-CM

## 2021-05-19 ENCOUNTER — Other Ambulatory Visit: Payer: Self-pay | Admitting: Registered Nurse

## 2021-05-19 DIAGNOSIS — J301 Allergic rhinitis due to pollen: Secondary | ICD-10-CM

## 2021-06-14 ENCOUNTER — Other Ambulatory Visit: Payer: Self-pay | Admitting: Registered Nurse

## 2021-06-14 DIAGNOSIS — J301 Allergic rhinitis due to pollen: Secondary | ICD-10-CM

## 2021-07-17 ENCOUNTER — Other Ambulatory Visit: Payer: Self-pay | Admitting: Registered Nurse

## 2021-10-28 ENCOUNTER — Other Ambulatory Visit: Payer: Self-pay | Admitting: Registered Nurse

## 2021-10-28 DIAGNOSIS — F515 Nightmare disorder: Secondary | ICD-10-CM

## 2021-10-28 DIAGNOSIS — Z8639 Personal history of other endocrine, nutritional and metabolic disease: Secondary | ICD-10-CM

## 2021-12-01 IMAGING — DX DG CHEST 1V PORT
1 series · 1 of 1 positions shown · non-contrast
Comparison: May 22, 2016

CLINICAL DATA: COVID positive

EXAM:
PORTABLE CHEST 1 VIEW

[chest ap]
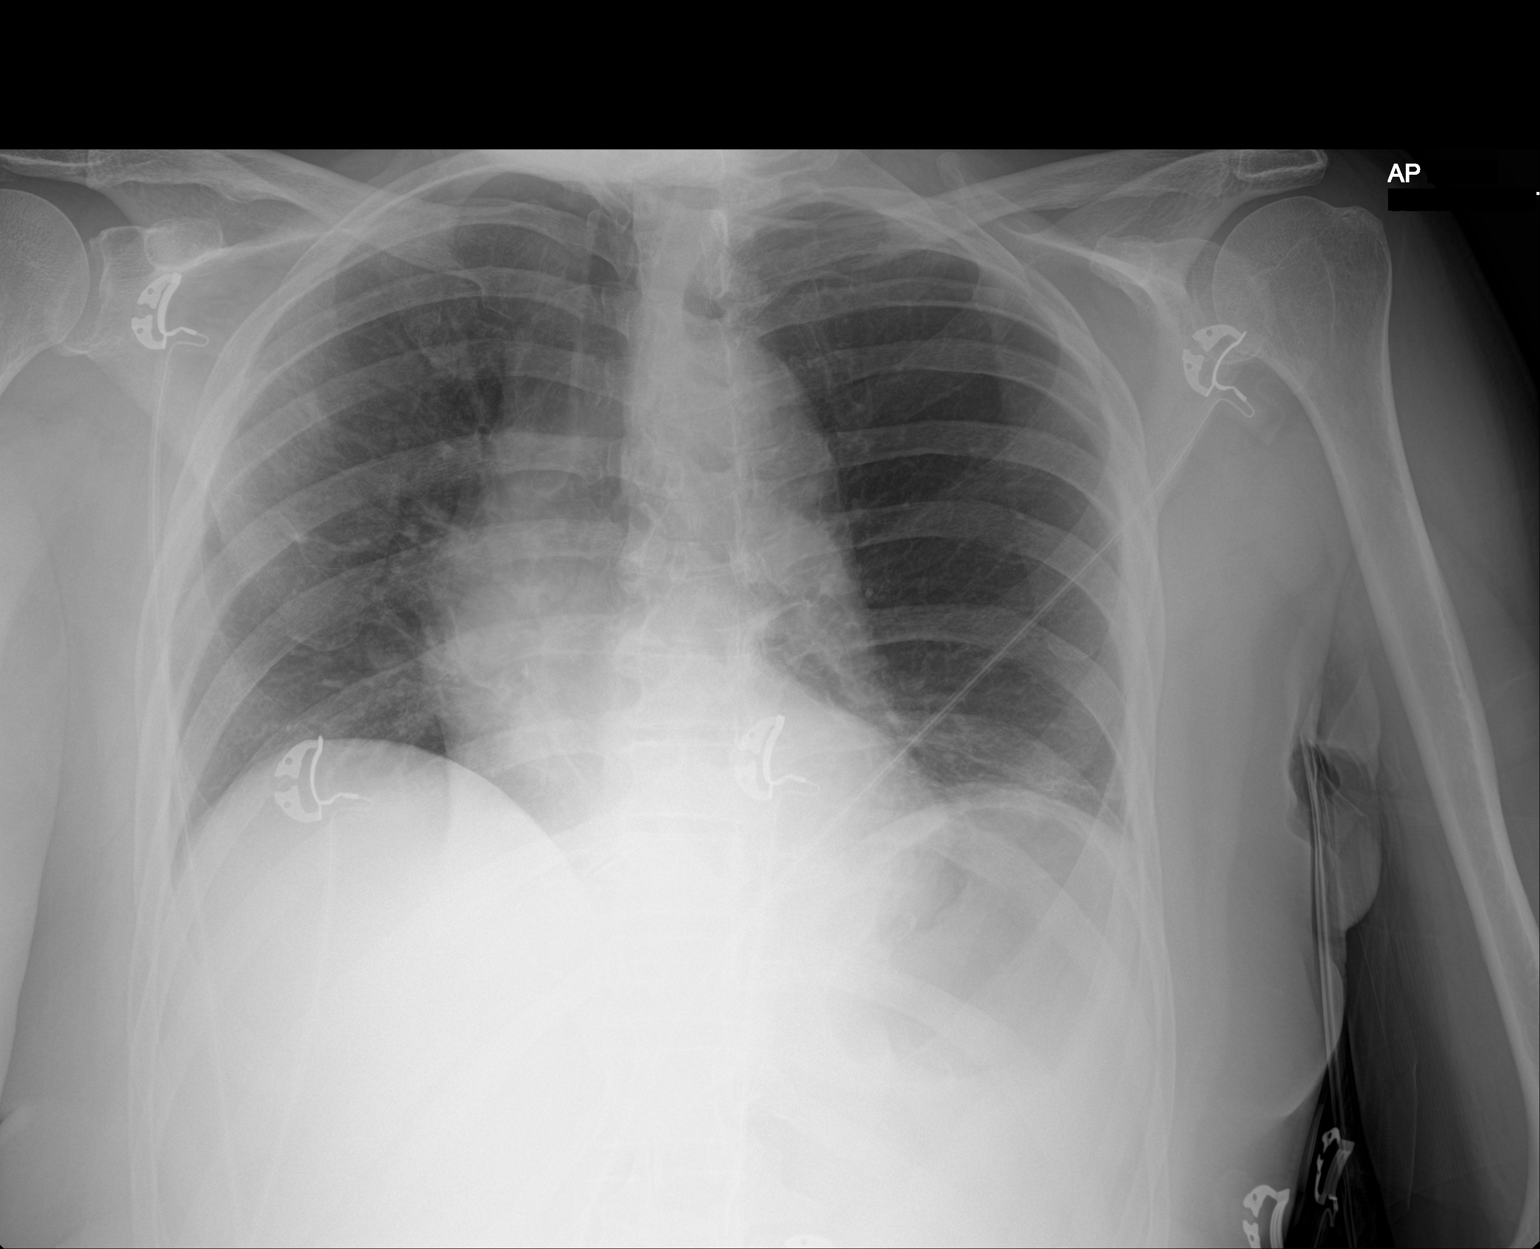

[1 of 1 positions shown; findings below may reference images not displayed]

FINDINGS: The heart size and mediastinal contours are within normal limits
given degree of rotation. There is shallow degree of aeration with
subsegmental atelectasis at the left lung base. Hazy airspace
opacity seen within the right infrahilar region. There is also
mildly increased airspace opacity in the periphery of the right
upper lung. No pleural effusion. No acute osseous abnormality.
IMPRESSION: Multifocal airspace opacities in the right lung which could be due
to multifocal pneumonia.

## 2022-04-02 ENCOUNTER — Telehealth: Payer: Self-pay | Admitting: *Deleted

## 2022-04-02 ENCOUNTER — Ambulatory Visit
Admission: EM | Admit: 2022-04-02 | Discharge: 2022-04-02 | Disposition: A | Discharge status: Home / Self Care | Payer: Medicare (Managed Care)

## 2022-04-02 ENCOUNTER — Encounter: Payer: Self-pay | Admitting: *Deleted

## 2022-04-02 ENCOUNTER — Other Ambulatory Visit: Payer: Self-pay

## 2022-04-02 DIAGNOSIS — M79601 Pain in right arm: Secondary | ICD-10-CM | POA: Diagnosis not present

## 2022-04-02 DIAGNOSIS — I10 Essential (primary) hypertension: Secondary | ICD-10-CM

## 2022-04-02 DIAGNOSIS — M79602 Pain in left arm: Secondary | ICD-10-CM | POA: Diagnosis not present

## 2022-04-02 DIAGNOSIS — J309 Allergic rhinitis, unspecified: Secondary | ICD-10-CM

## 2022-04-02 DIAGNOSIS — J301 Allergic rhinitis due to pollen: Secondary | ICD-10-CM

## 2022-04-02 LAB — POCT FASTING CBG KUC MANUAL ENTRY: POCT Glucose (KUC): 97 mg/dL (ref 70–99)

## 2022-04-02 MED ORDER — AZELASTINE HCL 0.1 % NA SOLN: 2.0000 | Freq: Two times a day (BID) | NASAL | 0 refills | Status: AC

## 2022-04-02 MED ORDER — AZELASTINE HCL 0.1 % NA SOLN: 1.0000 | Freq: Two times a day (BID) | NASAL | Status: DC

## 2022-04-02 NOTE — ED Notes (Addendum)
Pt requesting CBG check, as she hasn't had hers checked in a while. Also notes that she feels her bilat ankles are a little more swollen than usual since yesterday.

## 2022-04-02 NOTE — ED Triage Notes (Addendum)
Pt states she was walking briskly 4 days ago when she felt gradual onset bilat upper arms. States pain is intermittent and usually just happens when she's "walking real fast" and swinging her arms. Denies parasthesias. BUE CMS intact. Denies any chest pain or discomfort at any time since sx onset.  During triage, pt states she started with itchy eyes, runny nose, sneezing, slight cough few days ago as well. Denies fevers.

## 2022-04-02 NOTE — ED Provider Notes (Signed)
[facility]    CSN: [id number] Arrival date & time: 04/02/22  1755    HISTORY   Chief Complaint  Patient presents with   Arm Pain   HPI Susan Berg is a pleasant, 76 y.o. female who presents to urgent care today. Patient states that 4 days ago she was walking briskly and gradually began to have pain in her bilateral upper arms.  Patient states this usually happens when she is walking fast and swinging her arms while walking.  Patient states her fingers are not numb and denies coldness the tips of her fingers.  Patient also denies chest pain shortness of breath when this happens.  Also, patient states she has noticed that her eyes are itchy, that she has had runny nose, creased frequency of sneezing and a slight cough for the past few days.  Patient denies fever, body aches, chills, nausea, vomiting, diarrhea, known sick contacts.  Patient asked to have her blood sugar checked on arrival today, it was 97.  Patient has significantly elevated blood pressure at this time, otherwise vital signs are normal.  The history is provided by the patient.  Arm Pain   Past Medical History:  Diagnosis Date   High cholesterol    Hypertension    Vertigo    Vitamin D deficiency    Patient Active Problem List   Diagnosis Date Noted   Tinnitus of both ears 09/06/2019   Rash and nonspecific skin eruption 09/06/2019   Past Surgical History:  Procedure Laterality Date   ANKLE SURGERY     OB History   No obstetric history on file.    Home Medications    Prior to Admission medications   Medication Sig Start Date End Date Taking? Authorizing Provider  amLODipine (NORVASC) 5 MG tablet Take 5 mg by mouth daily.   Yes [provider]  losartan (COZAAR) 25 MG tablet TAKE 1 TABLET BY MOUTH EVERY DAY 07/17/21  Yes Maximiano Coss, NP  simvastatin (ZOCOR) 40 MG tablet TAKE 1 TABLET BY MOUTH EVERY DAY 10/29/21  Yes Maximiano Coss, NP  Azelastine HCl 137 MCG/SPRAY SOLN PLACE 1  SPRAY INTO BOTH NOSTRILS 2 (TWO) TIMES DAILY. USE IN EACH NOSTRIL AS DIRECTED 06/14/21   Maximiano Coss, NP  cholecalciferol (VITAMIN D3) 25 MCG (1000 UNIT) tablet Take 1,000 Units by mouth in the morning and at bedtime.    [provider]  fexofenadine (ALLEGRA) 180 MG tablet Take 1 tablet (180 mg total) by mouth daily. Patient taking differently: Take 180 mg by mouth daily as needed for allergies.  08/22/15 04/15/19  Billy Fischer, MD  fluticasone (FLONASE) 50 MCG/ACT nasal spray Place 1 spray into both nostrils 2 (two) times daily. Patient taking differently: Place 1 spray into both nostrils daily as needed for allergies.  08/22/15 09/15/19  Billy Fischer, MD    Family History Family History  Problem Relation Age of Onset   Diabetes Mother    Rheum arthritis Mother    Stroke Mother    Cancer Father    Social History Social History   Tobacco Use   Smoking status: Never   Smokeless tobacco: Never  Vaping Use   Vaping Use: Never used  Substance Use Topics   Alcohol use: Yes    Comment: rarely   Drug use: No   Allergies   Codeine, Vytorin [ezetimibe-simvastatin], and Lipitor [atorvastatin]  Review of Systems Review of Systems Pertinent findings revealed after performing a 14 point review of systems has  been noted in the history of present illness.  Physical Exam Triage Vital Signs ED Triage Vitals  Enc Vitals Group     BP 03/08/21 0827 (!) 147/82     Pulse Rate 03/08/21 0827 72     Resp 03/08/21 0827 18     Temp 03/08/21 0827 98.3 F (36.8 C)     Temp Source 03/08/21 0827 Oral     SpO2 03/08/21 0827 98 %     Weight --      Height --      Head Circumference --      Peak Flow --      Pain Score 03/08/21 0826 5     Pain Loc --      Pain Edu? --      Excl. in [place]? --   No data found.  Updated Vital Signs BP (!) 166/93   Pulse 97   Temp 98.6 F (37 C) (Oral)   Resp 20   SpO2 95%   Physical Exam Vitals and nursing note reviewed.  Constitutional:       General: She is not in acute distress.    Appearance: Normal appearance. She is not ill-appearing.  HENT:     Head: Normocephalic and atraumatic.     Salivary Glands: Right salivary gland is not diffusely enlarged or tender. Left salivary gland is not diffusely enlarged or tender.     Right Ear: Tympanic membrane, ear canal and external ear normal. No drainage. No middle ear effusion. There is no impacted cerumen. Tympanic membrane is not erythematous or bulging.     Left Ear: Tympanic membrane, ear canal and external ear normal. No drainage.  No middle ear effusion. There is no impacted cerumen. Tympanic membrane is not erythematous or bulging.     Nose: Nose normal. No nasal deformity, septal deviation, mucosal edema, congestion or rhinorrhea.     Right Turbinates: Not enlarged, swollen or pale.     Left Turbinates: Not enlarged, swollen or pale.     Right Sinus: No maxillary sinus tenderness or frontal sinus tenderness.     Left Sinus: No maxillary sinus tenderness or frontal sinus tenderness.     Mouth/Throat:     Lips: Pink. No lesions.     Mouth: Mucous membranes are moist. No oral lesions.     Pharynx: Oropharynx is clear. Uvula midline. No posterior oropharyngeal erythema or uvula swelling.     Tonsils: No tonsillar exudate. 0 on the right. 0 on the left.  Eyes:     General: Lids are normal.        Right eye: No discharge.        Left eye: No discharge.     Extraocular Movements: Extraocular movements intact.     Conjunctiva/sclera: Conjunctivae normal.     Right eye: Right conjunctiva is not injected.     Left eye: Left conjunctiva is not injected.  Neck:     Trachea: Trachea and phonation normal.  Cardiovascular:     Rate and Rhythm: Normal rate and regular rhythm.     Pulses: Normal pulses.     Heart sounds: Normal heart sounds. No murmur heard.    No friction rub. No gallop.  Pulmonary:     Effort: Pulmonary effort is normal. No accessory muscle usage, prolonged  expiration or respiratory distress.     Breath sounds: Normal breath sounds. No stridor, decreased air movement or transmitted upper airway sounds. No decreased breath sounds, wheezing, rhonchi or rales.  Chest:     Chest wall: No tenderness.  Musculoskeletal:        General: Normal range of motion.     Cervical back: Normal range of motion and neck supple. Normal range of motion.  Lymphadenopathy:     Cervical: No cervical adenopathy.  Skin:    General: Skin is warm and dry.     Findings: No erythema or rash.  Neurological:     General: No focal deficit present.     Mental Status: She is alert and oriented to person, place, and time.  Psychiatric:        Mood and Affect: Mood normal.        Behavior: Behavior normal.     Visual Acuity Right Eye Distance:   Left Eye Distance:   Bilateral Distance:    Right Eye Near:   Left Eye Near:    Bilateral Near:     UC Couse / Diagnostics / Procedures:     Radiology No results found.  Procedures Procedures (including critical care time) EKG  Pending results:  Labs Reviewed  POCT FASTING CBG [facility] MANUAL ENTRY    Medications Ordered in UC: Medications - No data to display  UC Diagnoses / Final Clinical Impressions(s)   I have reviewed the triage vital signs and the nursing notes.  Pertinent labs & imaging results that were available during my care of the patient were reviewed by me and considered in my medical decision making (see chart for details).    Final diagnoses:  Allergic rhinitis, unspecified seasonality, unspecified trigger  Essential hypertension  Pain in both upper extremities   Patient advised to follow-up with primary care regarding all of the above problems as they are all chronic and not requiring urgent attention.  Patient advised to be sure that she is taking her blood pressure medication on a regular basis and following closely with her primary care provider.  Education provided regarding adverse effects  of intermittently controlled blood pressure.  Patient's prescription for Astelin renewed as a 1 time courtesy.  ED Prescriptions     Medication Sig Dispense Auth. Provider   azelastine (ASTELIN) 0.1 % nasal spray Place 2 sprays into both nostrils 2 (two) times daily. Use in each nostril as directed 30 mL Lynden Oxford Scales, PA-C      I have reviewed the PDMP during this encounter.  Pending results:  Labs Reviewed  POCT FASTING CBG [place]    Discharge Instructions:   Discharge Instructions      For cough, runny nose and sneezing please resume azelastine nasal spray which has been prescribed for you in the past by Mr. Morrow.  Please also follow-up with him regarding your elevated blood pressure to see if you would like to add anything to your current blood pressure regimen.  Please keep in mind that everyone's blood pressure is often higher in the evening, especially after meals.  Your EKG today does not show any acute findings concerning for cardiac injury, blockages or irregular rhythm.  Your blood sugar level was normal.  If you are concerned about having a blood clot in your upper arms which is causing arm pain, please go to the emergency room for further evaluation.  Thank you for visiting urgent care today.      Disposition Upon Discharge:  Condition: stable for discharge home  Patient presented with an acute illness with associated systemic symptoms and significant discomfort requiring urgent management. In my opinion, this is a condition  that a prudent lay person (someone who possesses an average knowledge of health and medicine) may potentially expect to result in complications if not addressed urgently such as respiratory distress, impairment of bodily function or dysfunction of bodily organs.   Routine symptom specific, illness specific and/or disease specific instructions were discussed with the patient and/or caregiver at length.   As such, the  patient has been evaluated and assessed, work-up was performed and treatment was provided in alignment with urgent care protocols and evidence based medicine.  Patient/parent/caregiver has been advised that the patient may require follow up for further testing and treatment if the symptoms continue in spite of treatment, as clinically indicated and appropriate.  Patient/parent/caregiver has been advised to return to the [facility] or PCP if no better; to PCP or the Emergency Department if new signs and symptoms develop, or if the current signs or symptoms continue to change or worsen for further workup, evaluation and treatment as clinically indicated and appropriate  The patient will follow up with their current PCP if and as advised. If the patient does not currently have a PCP we will assist them in obtaining one.   The patient may need specialty follow up if the symptoms continue, in spite of conservative treatment and management, for further workup, evaluation, consultation and treatment as clinically indicated and appropriate.   Patient/parent/caregiver verbalized understanding and agreement of plan as discussed.  All questions were addressed during visit.  Please see discharge instructions below for further details of plan.  This office note has been dictated using Museum/gallery curator.  Unfortunately, this method of dictation can sometimes lead to typographical or grammatical errors.  I apologize for your inconvenience in advance if this occurs.  Please do not hesitate to reach out to me if clarification is needed.      Lynden Oxford Scales, PA-C 04/05/22 1035

## 2022-04-02 NOTE — Discharge Instructions (Addendum)
For cough, runny nose and sneezing please resume azelastine nasal spray which has been prescribed for you in the past by Mr. Morrow.  Please also follow-up with him regarding your elevated blood pressure to see if you would like to add anything to your current blood pressure regimen.  Please keep in mind that everyone's blood pressure is often higher in the evening, especially after meals.  Your EKG today does not show any acute findings concerning for cardiac injury, blockages or irregular rhythm.  Your blood sugar level was normal.  If you are concerned about having a blood clot in your upper arms which is causing arm pain, please go to the emergency room for further evaluation.  Thank you for visiting urgent care today.

## 2022-04-08 ENCOUNTER — Telehealth: Payer: Self-pay | Admitting: Registered Nurse

## 2022-04-08 NOTE — Telephone Encounter (Signed)
Patient has an appt Friday

## 2022-04-08 NOTE — Telephone Encounter (Signed)
Initial Comment Caller visited urgent care and was advised her BP medication dosage may need to be changed. Her ankles are swollen. Translation No Nurse Assessment Nurse: Patsey Berthold, RN, Roma Kayser Date/Time Eilene Ghazi Time): 04/07/2022 6:59:05 PM Confirm and document reason for call. If symptomatic, describe symptoms. ---Caller visited urgent care and was advised her BP medication dosage may need to be changed. Her ankles are swollen, was concerned and went to urgent care last week. Onset of swelling was last week on Tuesday. Does the patient have any new or worsening symptoms? ---Yes Will a triage be completed? ---Yes Related visit to physician within the last 2 weeks? ---Yes Does the PT have any chronic conditions? (i.e. diabetes, asthma, this includes High risk factors for pregnancy, etc.) ---Yes List chronic conditions. ---HTN, HLD Is this a behavioral health or substance abuse call? ---No Guidelines Guideline Title Affirmed Question Affirmed Notes Nurse Date/Time Eilene Ghazi Time) Ankle Swelling MILD or MODERATE ankle swelling (e.g., can't move joint normally, can't do Patsey Berthold, RN, Roma Kayser 04/07/2022 7:00:49 PM PLEASE NOTE: All timestamps contained within this report are represented as [place] Standard Time. CONFIDENTIALTY NOTICE: This fax transmission is intended only for the addressee. It contains information that is legally privileged, confidential or otherwise protected from use or disclosure. If you are not the intended recipient, you are strictly prohibited from reviewing, disclosing, copying using or disseminating any of this information or taking any action in reliance on or regarding this information. If you have received this fax in error, please notify us immediately by telephone so that we can arrange for its return to [place]. Phone: [phone], Toll-Free: [phone], Fax: [phone] Page: 2 of 2 Call Id: [id number] Guidelines Guideline Title Affirmed Question  Affirmed Notes Nurse Date/Time Eilene Ghazi Time) usual activities) (Exceptions: Itchy, localized swelling; swelling is chronic.) Disp. Time Eilene Ghazi Time) Disposition Final User 04/07/2022 6:03:43 PM Attempt made - message left Vivi Ferns 04/07/2022 7:04:21 PM SEE PCP WITHIN 3 DAYS Yes Patsey Berthold RN, Roma Kayser Final Disposition 04/07/2022 7:04:21 PM SEE PCP WITHIN 3 DAYS Yes Patsey Berthold, RN, Melvyn Novas Disagree/Comply Comply Caller Understands Yes PreDisposition Call Doctor Care Advice Given Per Guideline SEE PCP WITHIN 3 DAYS: * You need to be seen within 2 or 3 days. ELEVATE THE ANKLE: * Elevate swollen ankle (above the heart) at least three times a day. * Do this for 20 minutes each time. CALL BACK IF: * Severe pain * Fever occurs * Looks infected (spreading redness, red streak) * You become worse CARE ADVICE given per Ankle Joint, Swelling of (Adult) guideline. Referrals REFERRED TO PCP OFFICE  Called pt no answer, LM for pt to call back if she has any questions and concerns.

## 2022-04-11 ENCOUNTER — Encounter (HOSPITAL_COMMUNITY): Payer: Self-pay

## 2022-04-11 ENCOUNTER — Emergency Department (HOSPITAL_COMMUNITY)
Admission: EM | Admit: 2022-04-11 | Discharge: 2022-04-11 | Discharge status: Against Medical Advice | Payer: Medicare (Managed Care) | Attending: Emergency Medicine | Admitting: Emergency Medicine

## 2022-04-11 ENCOUNTER — Emergency Department (HOSPITAL_COMMUNITY): Payer: Medicare (Managed Care)

## 2022-04-11 ENCOUNTER — Emergency Department (HOSPITAL_BASED_OUTPATIENT_CLINIC_OR_DEPARTMENT_OTHER)
Admit: 2022-04-11 | Discharge: 2022-04-11 | Disposition: A | Discharge status: Home / Self Care | Payer: Medicare (Managed Care)

## 2022-04-11 ENCOUNTER — Inpatient Hospital Stay: Payer: Medicare (Managed Care) | Admitting: Family Medicine

## 2022-04-11 ENCOUNTER — Other Ambulatory Visit: Payer: Self-pay

## 2022-04-11 DIAGNOSIS — M7989 Other specified soft tissue disorders: Secondary | ICD-10-CM | POA: Insufficient documentation

## 2022-04-11 DIAGNOSIS — R52 Pain, unspecified: Secondary | ICD-10-CM

## 2022-04-11 DIAGNOSIS — Z5321 Procedure and treatment not carried out due to patient leaving prior to being seen by health care provider: Secondary | ICD-10-CM | POA: Insufficient documentation

## 2022-04-11 DIAGNOSIS — I10 Essential (primary) hypertension: Secondary | ICD-10-CM | POA: Diagnosis not present

## 2022-04-11 LAB — CBC WITH DIFFERENTIAL/PLATELET
Abs Immature Granulocytes: 0.01 10*3/uL (ref 0.00–0.07)
Basophils Absolute: 0 10*3/uL (ref 0.0–0.1)
Basophils Relative: 1 %
Eosinophils Absolute: 0.1 10*3/uL (ref 0.0–0.5)
Eosinophils Relative: 2 %
HCT: 38.2 % (ref 36.0–46.0)
Hemoglobin: 12.3 g/dL (ref 12.0–15.0)
Immature Granulocytes: 0 %
Lymphocytes Relative: 25 %
Lymphs Abs: 1.2 10*3/uL (ref 0.7–4.0)
MCH: 30.2 pg (ref 26.0–34.0)
MCHC: 32.2 g/dL (ref 30.0–36.0)
MCV: 93.9 fL (ref 80.0–100.0)
Monocytes Absolute: 0.3 10*3/uL (ref 0.1–1.0)
Monocytes Relative: 6 %
Neutro Abs: 3.1 10*3/uL (ref 1.7–7.7)
Neutrophils Relative %: 66 %
Platelets: 255 10*3/uL (ref 150–400)
RBC: 4.07 MIL/uL (ref 3.87–5.11)
RDW: 12.7 % (ref 11.5–15.5)
WBC: 4.6 10*3/uL (ref 4.0–10.5)
nRBC: 0 % (ref 0.0–0.2)

## 2022-04-11 LAB — COMPREHENSIVE METABOLIC PANEL
ALT: 25 U/L (ref 0–44)
AST: 23 U/L (ref 15–41)
Albumin: 4.2 g/dL (ref 3.5–5.0)
Alkaline Phosphatase: 60 U/L (ref 38–126)
Anion gap: 10 (ref 5–15)
BUN: 15 mg/dL (ref 8–23)
CO2: 24 mmol/L (ref 22–32)
Calcium: 9.5 mg/dL (ref 8.9–10.3)
Chloride: 106 mmol/L (ref 98–111)
Creatinine, Ser: 0.9 mg/dL (ref 0.44–1.00)
GFR, Estimated: >60 mL/min (ref >60)
Glucose, Bld: 120 mg/dL — ABNORMAL HIGH (ref 70–99)
Potassium: 4.4 mmol/L (ref 3.5–5.1)
Sodium: 140 mmol/L (ref 135–145)
Total Bilirubin: 1.1 mg/dL (ref 0.3–1.2)
Total Protein: 8.1 g/dL (ref 6.5–8.1)

## 2022-04-11 NOTE — ED Triage Notes (Addendum)
Patient reports that she went to a [facility] a week ago and was concerned about hypertension and tightness in her bilateral upper extremities that she had 4 days ago after walking briskly.. Patient denies any chest pain or SOB.  Discharge instructions stated if you are concerned about having blood clots in your upper arms please go to the ER.  Patient also reports that she has had swelling in both of her feet x 1 week.

## 2022-04-11 NOTE — ED Provider Triage Note (Signed)
Emergency Medicine Provider Triage Evaluation Note  Susan Berg , a 76 y.o. female  was evaluated in triage.  Pt complains of hypertension and upper extremity tightness. Was recently at [facility] and told to go to ER if concerned about clots in arms due to symptoms. Reports bilateral calf tenderness as well. No history of blood clots or bleeding disorders. Denies chest pain, SOB, abdominal pain, nausea/vomiting, or diarrhea.  Review of Systems  Positive: As above Negative: As above  Physical Exam  BP 130/77 (BP Location: Right Arm)   Pulse 91   Temp 98.3 F (36.8 C) (Oral)   Resp 16   Ht '5\' 5"'$  (1.651 m)   Wt 83 kg   SpO2 99%   BMI 30.45 kg/m  Gen:   Awake, no distress   Resp:  Normal effort  MSK:   Moves extremities without difficulty. No specific pain on bilateral arms. Neurovascularly intact in upper and lower extremities. Other:  Focal tenderness in right and left calves.   Medical Decision Making  Medically screening exam initiated at 12:06 PM.  Appropriate orders placed.  Susan Berg was informed that the remainder of the evaluation will be completed by another provider, this initial triage assessment does not replace that evaluation, and the importance of remaining in the ED until their evaluation is complete.    Susan Heller, PA-C 04/11/22 1232

## 2022-04-11 NOTE — ED Provider Notes (Signed)
I spoke with this patient in the waiting room and explained that the hospital is very full so we are holding in the emergency department.  We understand that she is one of her longest weights however we discussed that it is not always first come first serve.  When reviewing her triage she does not have DVT studies ordered.  She is most concerned about blood clots in her legs and says that her right leg is getting more more swollen.  I will order these DVT studies given ultrasound will leave at 7 PM.  She agrees with the plan.   Rhae Hammock, PA-C 04/11/22 1801    Fredia Sorrow, MD 04/11/22 2024

## 2022-04-11 NOTE — Progress Notes (Signed)
Bilateral lower extremity venous duplex has been completed. Preliminary results can be found in CV Proc through chart review.  Results were given to [place] PA.  04/11/22 7:13 PM Susan Berg RVT

## 2022-04-24 ENCOUNTER — Ambulatory Visit (INDEPENDENT_AMBULATORY_CARE_PROVIDER_SITE_OTHER): Payer: Medicare (Managed Care) | Admitting: Family Medicine

## 2022-04-24 ENCOUNTER — Encounter: Payer: Self-pay | Admitting: Family Medicine

## 2022-04-24 VITALS — BP 132/70 | HR 91 | Temp 98.1°F | Ht 65.0 in | Wt 186.4 lb

## 2022-04-24 DIAGNOSIS — M79601 Pain in right arm: Secondary | ICD-10-CM

## 2022-04-24 DIAGNOSIS — I10 Essential (primary) hypertension: Secondary | ICD-10-CM | POA: Diagnosis not present

## 2022-04-24 DIAGNOSIS — R600 Localized edema: Secondary | ICD-10-CM | POA: Diagnosis not present

## 2022-04-24 DIAGNOSIS — L989 Disorder of the skin and subcutaneous tissue, unspecified: Secondary | ICD-10-CM | POA: Diagnosis not present

## 2022-04-24 DIAGNOSIS — M5030 Other cervical disc degeneration, unspecified cervical region: Secondary | ICD-10-CM

## 2022-04-24 DIAGNOSIS — I6522 Occlusion and stenosis of left carotid artery: Secondary | ICD-10-CM | POA: Diagnosis not present

## 2022-04-24 DIAGNOSIS — M79602 Pain in left arm: Secondary | ICD-10-CM

## 2022-04-24 MED ORDER — LOSARTAN POTASSIUM 50 MG PO TABS: 50.0000 mg | ORAL_TABLET | Freq: Every day | ORAL | 1 refills | Status: AC

## 2022-04-24 MED ORDER — AMLODIPINE BESYLATE 5 MG PO TABS: 5.0000 mg | ORAL_TABLET | Freq: Every day | ORAL | 1 refills | Status: AC

## 2022-04-24 NOTE — Patient Instructions (Addendum)
Try decreasing the amlodipine to '5mg'$  per day for now, and increase losartan to '50mg'$  per day. That should help ankle swelling. Follow up if not improving. Recheck with new PCP in 6 weeks. Here is a link to see if there is a office closer than here if needed.   [email or web address]  I will order ultrasound to look at carotid arteries.   Arm pain may be coming from neck.  I will refer you to neck specialist. Avoid activities that cause pain for now. Tylenol or rare ibuprofen ok temporarily if needed.   Talk to dermatologist about face bumps.  Return to the clinic or go to the nearest emergency room if any of your symptoms worsen or new symptoms occur.  Peripheral Edema  Peripheral edema is swelling that is caused by a buildup of fluid. Peripheral edema most often affects the lower legs, ankles, and feet. It can also develop in the arms, hands, and face. The area of the body that has peripheral edema will look swollen. It may also feel heavy or warm. Your clothes may start to feel tight. Pressing on the area may make a temporary dent in your skin (pitting edema). You may not be able to move your swollen arm or leg as much as usual. There are many causes of peripheral edema. It can happen because of a complication of other conditions such as heart failure, kidney disease, or a problem with your circulation. It also can be a side effect of certain medicines or happen because of an infection. It often happens to women during pregnancy. Sometimes, the cause is not known. Follow these instructions at home: Managing pain, stiffness, and swelling  Raise (elevate) your legs while you are sitting or lying down. Move around often to prevent stiffness and to reduce swelling. Do not sit or stand for long periods of time. Do not wear tight clothing. Do not wear garters on your upper legs. Exercise your legs to get your circulation going. This helps to move the fluid back into  your blood vessels, and it may help the swelling go down. Wear compression stockings as told by your health care provider. These stockings help to prevent blood clots and reduce swelling in your legs. It is important that these are the correct size. These stockings should be prescribed by your doctor to prevent possible injuries. If elastic bandages or wraps are recommended, use them as told by your health care provider. Medicines Take over-the-counter and prescription medicines only as told by your health care provider. Your health care provider may prescribe medicine to help your body get rid of excess water (diuretic). Take this medicine if you are told to take it. General instructions Eat a low-salt (low-sodium) diet as told by your health care provider. Sometimes, eating less salt may reduce swelling. Pay attention to any changes in your symptoms. Moisturize your skin daily to help prevent skin from cracking and draining. Keep all follow-up visits. This is important. Contact a health care provider if: You have a fever. You have swelling in only one leg. You have increased swelling, redness, or pain in one or both of your legs. You have drainage or sores at the area where you have edema. Get help right away if: You have edema that starts suddenly or is getting worse, especially if you are pregnant or have a medical condition. You develop shortness of breath, especially when you are lying down. You have pain in your chest or abdomen. You feel weak. You feel  like you will faint. These symptoms may be an emergency. Get help right away. Call 911. Do not wait to see if the symptoms will go away. Do not drive yourself to the hospital. Summary Peripheral edema is swelling that is caused by a buildup of fluid. Peripheral edema most often affects the lower legs, ankles, and feet. Move around often to prevent stiffness and to reduce swelling. Do not sit or stand for long periods of time. Pay  attention to any changes in your symptoms. Contact a health care provider if you have edema that starts suddenly or is getting worse, especially if you are pregnant or have a medical condition. Get help right away if you develop shortness of breath, especially when lying down. This information is not intended to replace advice given to you by your health care provider. Make sure you discuss any questions you have with your health care provider. Document Revised: 12/31/2020 Document Reviewed: 12/31/2020 Elsevier Patient Education  [place].

## 2022-04-24 NOTE — Progress Notes (Signed)
Subjective:  Patient ID: Susan Berg, female    DOB: 1946-01-02  Age: 76 y.o. MRN: [id number]  CC:  Chief Complaint  Patient presents with   Hospitalization Follow-up    Pt states she went to urgent care due to her ankles swelling and she was having pain in her arms and they thought she had blood clots, she thinks they found something in her neck but she didn't get her results because she had to leave    [place]    Pt has a bump on the side of her face she wants you to look at    HPI [place] presents for   Leg swelling Follow-up from ER visit as above.  Previous patient of Maximiano Coss, NP. Chart reviewed, evaluated at [facility], ER on April 11, 2022 for foot swelling, leg swelling.   Concern for DVT at that time, ultrasound was ordered, no evidence of DVT in left or right lower extremity.  CBC was normal, CMP normal with exception of glucose 120.  She left prior to completion of ER visit. She does take amlodipine '10mg'$  (increased from '5mg'$  in past year, no swelling in ankles at higher dose). losartan '25mg'$  for hypertension.  No chest pain or dyspnea, no hand swelling. No PND/orthopnea  Swelling in ankles only. No added salt in diet.   BP Readings from Last 3 Encounters:  04/24/22 132/70  04/11/22 130/73  04/02/22 (!) 166/93   Lab Results  Component Value Date   CREATININE 0.90 04/11/2022   Pains in arms: Noticed with a walking briskly, does move/swing arms.upper arms, both sides, sharp pains. No chest pain/dyspnea. Improves with rest.  No hand parasthesias, no weakness of arms. Sore radiating from neck to both upper arms.  In the ER, CT cervical spine was obtained for neck pain, cervical radiculopathy.Cervical spondylosis, degenerative disc disease with multilevel foraminal impingement, 2.5 mm degenerative anterolisthesis at T1-2.Marland Kitchen  Loss of normal cervical lordosis.  Some levoconvex lower cervical scoliotic curvature.  Mild atherosclerotic calcification of left common  carotid artery. No tx. Has taken tylenol in past. Has ibuprofen at home - not using recently  Bump on face.  In front of R ear. Past week. No pain, slight itch. No R ear pain. Hearing ok.  Has dermatologist in [place]. Will see them soon.   History Patient Active Problem List   Diagnosis Date Noted   Tinnitus of both ears 09/06/2019   Rash and nonspecific skin eruption 09/06/2019   Past Medical History:  Diagnosis Date   High cholesterol    Hypertension    Vertigo    Vitamin D deficiency    Past Surgical History:  Procedure Laterality Date   ANKLE SURGERY     Allergies  Allergen Reactions   Codeine Nausea And Vomiting, Nausea Only and Other (See Comments)   Amoxicillin Swelling   Vytorin [Ezetimibe-Simvastatin] Hives   Lipitor [Atorvastatin] Rash   Prior to Admission medications   Medication Sig Start Date End Date Taking? Authorizing Provider  amLODipine (NORVASC) 10 MG tablet Take 10 mg by mouth daily. 03/31/22  Yes [provider]  amLODipine (NORVASC) 5 MG tablet Take 5 mg by mouth daily.   Yes [provider]  azelastine (ASTELIN) 0.1 % nasal spray Place 2 sprays into both nostrils 2 (two) times daily. Use in each nostril as directed 04/02/22  Yes Lynden Oxford Scales, PA-C  cholecalciferol (VITAMIN D3) 25 MCG (1000 UNIT) tablet Take 1,000 Units by mouth in the  morning and at bedtime.   Yes [provider]  losartan (COZAAR) 25 MG tablet TAKE 1 TABLET BY MOUTH EVERY DAY 07/17/21  Yes Maximiano Coss, NP  simvastatin (ZOCOR) 40 MG tablet TAKE 1 TABLET BY MOUTH EVERY DAY 10/29/21  Yes Maximiano Coss, NP  losartan (COZAAR) 25 MG tablet Take by mouth.    [provider]  simvastatin (ZOCOR) 40 MG tablet Take by mouth.    [provider]  fexofenadine (ALLEGRA) 180 MG tablet Take 1 tablet (180 mg total) by mouth daily. Patient taking differently: Take 180 mg by mouth daily as needed for allergies.  08/22/15 04/15/19  Billy Fischer, MD  fluticasone (FLONASE) 50 MCG/ACT nasal spray Place 1 spray into both nostrils 2 (two) times daily. Patient taking differently: Place 1 spray into both nostrils daily as needed for allergies.  08/22/15 09/15/19  Billy Fischer, MD   Social History   Socioeconomic History   Marital status: Widowed    Spouse name: Not on file   Number of children: 2   Years of education: Not on file   Highest education level: Not on file  Occupational History   Not on file  Tobacco Use   Smoking status: Never   Smokeless tobacco: Never  Vaping Use   Vaping Use: Never used  Substance and Sexual Activity   Alcohol use: Yes    Comment: rarely   Drug use: No   Sexual activity: Not on file  Other Topics Concern   Not on file  Social History Narrative   Not on file   Social Determinants of Health   Financial Resource Strain: Low Risk  (01/17/2021)   Overall Financial Resource Strain (CARDIA)    Difficulty of Paying Living Expenses: Not hard at all  Food Insecurity: No Food Insecurity (01/17/2021)   Hunger Vital Sign    Worried About Running Out of Food in the Last Year: Never true    [place] in the Last Year: Never true  Transportation Needs: No Transportation Needs (01/17/2021)   PRAPARE - Hydrologist (Medical): No    Lack of Transportation (Non-Medical): No  Physical Activity: Insufficiently Active (01/17/2021)   Exercise Vital Sign    Days of Exercise per Week: 3 days    Minutes of Exercise per Session: 30 min  Stress: No Stress Concern Present (01/17/2021)   [place]    Feeling of Stress : Only a little  Social Connections: Socially Isolated (01/17/2021)   Social Connection and Isolation Panel [NHANES]    Frequency of Communication with Friends and Family: Three times a week    Frequency of Social Gatherings with Friends and Family: Three times a week    Attends Religious Services: Never     Active Member of Clubs or Organizations: No    Attends Archivist Meetings: Never    Marital Status: Widowed  Intimate Partner Violence: Not At Risk (01/17/2021)   Humiliation, Afraid, Rape, and Kick questionnaire    Fear of Current or Ex-Partner: No    Emotionally Abused: No    Physically Abused: No    Sexually Abused: No    Review of Systems Per HPI.   Objective:   Vitals:   04/24/22 1421  BP: 132/70  Pulse: 91  Temp: 98.1 F (36.7 C)  SpO2: 98%  Weight: 186 lb 6.4 oz (84.6 kg)  Height: '5\' 5"'$  (1.651 m)  Physical Exam Vitals reviewed.  Constitutional:      Appearance: Normal appearance. She is well-developed.  HENT:     Head: Normocephalic and atraumatic.  Eyes:     Conjunctiva/sclera: Conjunctivae normal.     Pupils: Pupils are equal, round, and reactive to light.  Neck:     Vascular: No carotid bruit.  Cardiovascular:     Rate and Rhythm: Normal rate and regular rhythm.     Heart sounds: Normal heart sounds.  Pulmonary:     Effort: Pulmonary effort is normal.     Breath sounds: Normal breath sounds.  Abdominal:     Palpations: Abdomen is soft. There is no pulsatile mass.     Tenderness: There is no abdominal tenderness.  Musculoskeletal:     Right lower leg: Edema (Trace nonpitting edema ankles.) present.     Left lower leg: Edema present.     Comments: C-spine nontender midline.  Does have decreased range of motion with rotation, lateral flexion bilaterally as well as extension.  Some discomfort into the left paraspinals with range of motion.  Reproducible pain into her shoulder on left.  Lymphadenopathy:     Cervical: No cervical adenopathy.  Skin:    General: Skin is warm and dry.     Comments: Right face, 2 mm, 3 mm slightly elevated flesh-colored lesions, see photo no surrounding erythema.  No discharge.  Neurological:     Mental Status: She is alert and oriented to person, place, and time.  Psychiatric:        Mood and Affect: Mood  normal.        Behavior: Behavior normal.       56 minutes spent during visit, including chart review, ER note, lab/study review, discussion of various concerns as above as well as review of lab results from the ER.  Counseling and assimilation of information, exam, discussion of plan, and chart completion.    Assessment & Plan:  Susan Berg is a 76 y.o. female . Calcification of left carotid artery - Plan: US Carotid Duplex Bilateral  -No bruit on exam, check ultrasound to evaluate for any stenosis.  Essential hypertension - Plan: amLODipine (NORVASC) 5 MG tablet, losartan (COZAAR) 50 MG tablet Pedal edema - Plan: amLODipine (NORVASC) 5 MG tablet, losartan (COZAAR) 50 MG tablet  -Pedal edema likely due to higher dose amlodipine, otherwise appears euvolemic.  Lungs are clear, no sign of failure.  Will try 5 mg amlodipine dosing, increase losartan to 50 mg for BP treatment.  Monitor home readings with follow-up in approximately 6 weeks with new PCP, labs at that time.  RTC precautions.  Facial lesion  -Has dermatologist and plans to follow-up in the next month.  Bilateral arm pain - Plan: Ambulatory referral to Spine Surgery DDD (degenerative disc disease), cervical - Plan: Ambulatory referral to Spine Surgery  -Possible radicular pain from underlying degenerative disc disease/spine disease.  Refer to spine specialist to decide on next step including need for advanced imaging.  Reassuring exam at present without apparent weakness.  Symptomatic care and avoidance of offending activities discussed.  She also asked that I review her labs that were obtained in the ER as she left prior to discussion with provider.  Discussed normal CBC and glucose of 121 CMP.  Planned follow-up with new PCP in the next 6 weeks, and with med changes today, and repeat renal function, other necessary labs can be obtained at that time.   Meds ordered this encounter  Medications  amLODipine (NORVASC) 5 MG  tablet    Sig: Take 1 tablet (5 mg total) by mouth daily.    Dispense:  90 tablet    Refill:  1   losartan (COZAAR) 50 MG tablet    Sig: Take 1 tablet (50 mg total) by mouth daily.    Dispense:  90 tablet    Refill:  1   Patient Instructions  Try decreasing the amlodipine to '5mg'$  per day for now, and increase losartan to '50mg'$  per day. That should help ankle swelling. Follow up if not improving. Recheck with new PCP in 6 weeks. Here is a link to see if there is a office closer than here if needed.   [email or web address]  I will order ultrasound to look at carotid arteries.   Arm pain may be coming from neck.  I will refer you to neck specialist. Avoid activities that cause pain for now. Tylenol or rare ibuprofen ok temporarily if needed.   Talk to dermatologist about face bumps.  Return to the clinic or go to the nearest emergency room if any of your symptoms worsen or new symptoms occur.  Peripheral Edema  Peripheral edema is swelling that is caused by a buildup of fluid. Peripheral edema most often affects the lower legs, ankles, and feet. It can also develop in the arms, hands, and face. The area of the body that has peripheral edema will look swollen. It may also feel heavy or warm. Your clothes may start to feel tight. Pressing on the area may make a temporary dent in your skin (pitting edema). You may not be able to move your swollen arm or leg as much as usual. There are many causes of peripheral edema. It can happen because of a complication of other conditions such as heart failure, kidney disease, or a problem with your circulation. It also can be a side effect of certain medicines or happen because of an infection. It often happens to women during pregnancy. Sometimes, the cause is not known. Follow these instructions at home: Managing pain, stiffness, and swelling  Raise (elevate) your legs while you are sitting or lying down. Move around  often to prevent stiffness and to reduce swelling. Do not sit or stand for long periods of time. Do not wear tight clothing. Do not wear garters on your upper legs. Exercise your legs to get your circulation going. This helps to move the fluid back into your blood vessels, and it may help the swelling go down. Wear compression stockings as told by your health care provider. These stockings help to prevent blood clots and reduce swelling in your legs. It is important that these are the correct size. These stockings should be prescribed by your doctor to prevent possible injuries. If elastic bandages or wraps are recommended, use them as told by your health care provider. Medicines Take over-the-counter and prescription medicines only as told by your health care provider. Your health care provider may prescribe medicine to help your body get rid of excess water (diuretic). Take this medicine if you are told to take it. General instructions Eat a low-salt (low-sodium) diet as told by your health care provider. Sometimes, eating less salt may reduce swelling. Pay attention to any changes in your symptoms. Moisturize your skin daily to help prevent skin from cracking and draining. Keep all follow-up visits. This is important. Contact a health care provider if: You have a fever. You have swelling in only one leg. You have increased swelling, redness,  or pain in one or both of your legs. You have drainage or sores at the area where you have edema. Get help right away if: You have edema that starts suddenly or is getting worse, especially if you are pregnant or have a medical condition. You develop shortness of breath, especially when you are lying down. You have pain in your chest or abdomen. You feel weak. You feel like you will faint. These symptoms may be an emergency. Get help right away. Call 911. Do not wait to see if the symptoms will go away. Do not drive yourself to the  hospital. Summary Peripheral edema is swelling that is caused by a buildup of fluid. Peripheral edema most often affects the lower legs, ankles, and feet. Move around often to prevent stiffness and to reduce swelling. Do not sit or stand for long periods of time. Pay attention to any changes in your symptoms. Contact a health care provider if you have edema that starts suddenly or is getting worse, especially if you are pregnant or have a medical condition. Get help right away if you develop shortness of breath, especially when lying down. This information is not intended to replace advice given to you by your health care provider. Make sure you discuss any questions you have with your health care provider. Document Revised: 12/31/2020 Document Reviewed: 12/31/2020 Elsevier Patient Education  [place],   Merri Ray, MD [facility] 04/24/22 3:15 PM

## 2022-04-25 ENCOUNTER — Encounter: Payer: Self-pay | Admitting: Family Medicine

## 2022-06-16 ENCOUNTER — Other Ambulatory Visit: Payer: Medicare (Managed Care)

## 2023-02-12 ENCOUNTER — Telehealth: Payer: Self-pay | Admitting: Family Medicine

## 2023-02-12 NOTE — Telephone Encounter (Signed)
Former from pharmacy regarding clinical concern of combination of amlodipine and simvastatin at doses exceeding 20 mg/day.  This can increase the risk of muscle issue.  I do not see an office visit since last December and no upcoming visit.  Please schedule visit to discuss medications and potential adjustment in meds for this concern.  Let me know if there are questions.

## 2023-02-13 NOTE — Telephone Encounter (Signed)
Sent to Dr.Greene for fyi

## 2023-02-13 NOTE — Telephone Encounter (Signed)
Pt is in [place] and will call the office upon her return and book a visit.

## 2023-05-28 ENCOUNTER — Emergency Department (HOSPITAL_COMMUNITY)
Admission: EM | Admit: 2023-05-28 | Discharge: 2023-05-29 | Disposition: A | Discharge status: Home / Self Care | Payer: Medicare (Managed Care) | Attending: Emergency Medicine | Admitting: Emergency Medicine

## 2023-05-28 ENCOUNTER — Other Ambulatory Visit: Payer: Self-pay

## 2023-05-28 ENCOUNTER — Encounter (HOSPITAL_COMMUNITY): Payer: Self-pay | Admitting: Emergency Medicine

## 2023-05-28 DIAGNOSIS — U071 COVID-19: Secondary | ICD-10-CM | POA: Insufficient documentation

## 2023-05-28 DIAGNOSIS — Z79899 Other long term (current) drug therapy: Secondary | ICD-10-CM | POA: Diagnosis not present

## 2023-05-28 DIAGNOSIS — R519 Headache, unspecified: Secondary | ICD-10-CM | POA: Diagnosis present

## 2023-05-28 DIAGNOSIS — I10 Essential (primary) hypertension: Secondary | ICD-10-CM | POA: Diagnosis not present

## 2023-05-28 LAB — COMPREHENSIVE METABOLIC PANEL
ALT: 21 U/L (ref 0–44)
AST: 19 U/L (ref 15–41)
Albumin: 4.1 g/dL (ref 3.5–5.0)
Alkaline Phosphatase: 52 U/L (ref 38–126)
Anion gap: 12 (ref 5–15)
BUN: 15 mg/dL (ref 8–23)
CO2: 24 mmol/L (ref 22–32)
Calcium: 9.6 mg/dL (ref 8.9–10.3)
Chloride: 102 mmol/L (ref 98–111)
Creatinine, Ser: 1.07 mg/dL — ABNORMAL HIGH (ref 0.44–1.00)
GFR, Estimated: 54 mL/min — ABNORMAL LOW (ref >60)
Glucose, Bld: 91 mg/dL (ref 70–99)
Potassium: 3.7 mmol/L (ref 3.5–5.1)
Sodium: 138 mmol/L (ref 135–145)
Total Bilirubin: 1.9 mg/dL — ABNORMAL HIGH (ref 0.0–1.2)
Total Protein: 8 g/dL (ref 6.5–8.1)

## 2023-05-28 LAB — CBC
HCT: 40.3 % (ref 36.0–46.0)
Hemoglobin: 13 g/dL (ref 12.0–15.0)
MCH: 30 pg (ref 26.0–34.0)
MCHC: 32.3 g/dL (ref 30.0–36.0)
MCV: 93.1 fL (ref 80.0–100.0)
Platelets: 214 10*3/uL (ref 150–400)
RBC: 4.33 MIL/uL (ref 3.87–5.11)
RDW: 12.6 % (ref 11.5–15.5)
WBC: 5.7 10*3/uL (ref 4.0–10.5)
nRBC: 0 % (ref 0.0–0.2)

## 2023-05-28 LAB — LIPASE, BLOOD: Lipase: 31 U/L (ref 11–51)

## 2023-05-28 NOTE — ED Triage Notes (Signed)
Patient c/o headache x 2 day. Patient report she haven't had anything to drink or eat x2 days. Patient feels weak and nauseous. Patient denies vomitting and fever. Patient ambulatory at triage.

## 2023-05-29 ENCOUNTER — Emergency Department (HOSPITAL_COMMUNITY): Payer: Medicare (Managed Care)

## 2023-05-29 DIAGNOSIS — U071 COVID-19: Secondary | ICD-10-CM | POA: Diagnosis not present

## 2023-05-29 LAB — URINALYSIS, ROUTINE W REFLEX MICROSCOPIC
Bilirubin Urine: NEGATIVE
Glucose, UA: NEGATIVE mg/dL
Ketones, ur: 5 mg/dL — AB
Nitrite: NEGATIVE
Protein, ur: 30 mg/dL — AB
Specific Gravity, Urine: 1.023 (ref 1.005–1.030)
pH: 5 (ref 5.0–8.0)

## 2023-05-29 LAB — RESP PANEL BY RT-PCR (RSV, FLU A&B, COVID)  RVPGX2
Influenza A by PCR: NEGATIVE
Influenza B by PCR: NEGATIVE
Resp Syncytial Virus by PCR: NEGATIVE
SARS Coronavirus 2 by RT PCR: POSITIVE — AB

## 2023-05-29 LAB — SEDIMENTATION RATE: Sed Rate: 69 mm/h — ABNORMAL HIGH (ref 0–22)

## 2023-05-29 MED ORDER — METOCLOPRAMIDE HCL 5 MG/ML IJ SOLN
10.0000 mg | Freq: Once | INTRAMUSCULAR | Status: AC
  Administered 2023-05-29: 10 mg via INTRAVENOUS
  Filled 2023-05-29: qty 2

## 2023-05-29 MED ORDER — FLUORESCEIN SODIUM 1 MG OP STRP
1.0000 | ORAL_STRIP | Freq: Once | OPHTHALMIC | Status: AC
  Administered 2023-05-29: 1 via OPHTHALMIC
  Filled 2023-05-29: qty 1

## 2023-05-29 MED ORDER — IOHEXOL 350 MG/ML SOLN
75.0000 mL | Freq: Once | INTRAVENOUS | Status: AC | PRN
  Administered 2023-05-29: 75 mL via INTRAVENOUS

## 2023-05-29 MED ORDER — LACTATED RINGERS IV BOLUS
1000.0000 mL | Freq: Once | INTRAVENOUS | Status: AC
  Administered 2023-05-29: 1000 mL via INTRAVENOUS

## 2023-05-29 MED ORDER — DIPHENHYDRAMINE HCL 50 MG/ML IJ SOLN
12.5000 mg | Freq: Once | INTRAMUSCULAR | Status: AC
  Administered 2023-05-29: 12.5 mg via INTRAVENOUS
  Filled 2023-05-29: qty 1

## 2023-05-29 MED ORDER — TETRACAINE HCL 0.5 % OP SOLN
2.0000 [drp] | Freq: Once | OPHTHALMIC | Status: AC
  Administered 2023-05-29: 2 [drp] via OPHTHALMIC
  Filled 2023-05-29: qty 4

## 2023-05-29 MED ORDER — PREDNISONE 50 MG PO TABS
ORAL_TABLET | ORAL | 0 refills | Status: DC
Start: 2023-05-29 — End: 2023-05-29

## 2023-05-29 MED ORDER — PREDNISONE 50 MG PO TABS
ORAL_TABLET | ORAL | 0 refills | Status: AC
Start: 2023-05-29 — End: ?

## 2023-05-29 MED ORDER — KETOROLAC TROMETHAMINE 30 MG/ML IJ SOLN
15.0000 mg | Freq: Once | INTRAMUSCULAR | Status: AC
  Administered 2023-05-29: 15 mg via INTRAVENOUS
  Filled 2023-05-29: qty 1

## 2023-05-29 NOTE — ED Provider Notes (Signed)
[place] EMERGENCY DEPARTMENT AT [facility] Provider Note   CSN: [id number] Arrival date & time: 05/28/23  1808     History  Chief Complaint  Patient presents with   Headache    Susan Berg is a 78 y.o. female.  Patient with a history of hypertension and hypercholesterol here with severe headache.  States headache started 2 days ago and is in the front of her head.  She believes it started suddenly and is gotten progressively worse.  Feels dehydrated and has not been eating or drinking much.  Has generalized weakness and nausea but no vomiting.  No chest pain.  No history of similar headache.  Is not sure if the headache is thunderclap in onset.  Denies any photophobia or phonophobia.  Has had nausea but no vomiting.  No fever but has had bodyaches and chills.  No chest pain.  No shortness of breath.  Mild productive cough of green mucus.  No abdominal pain.  No pain with urination or blood in the urine.  Has had sick contacts at home. No history of migraine type headaches. No visual changes.  The history is provided by the patient.  Headache Associated symptoms: congestion, cough, fatigue, myalgias, nausea, sore throat and weakness   Associated symptoms: no fever, no photophobia and no vomiting        Home Medications Prior to Admission medications   Medication Sig Start Date End Date Taking? Authorizing Provider  amLODipine (NORVASC) 5 MG tablet Take 1 tablet (5 mg total) by mouth daily. 04/24/22   Shade Flood, MD  azelastine (ASTELIN) 0.1 % nasal spray Place 2 sprays into both nostrils 2 (two) times daily. Use in each nostril as directed 04/02/22   Theadora Rama Scales, PA-C  cholecalciferol (VITAMIN D3) 25 MCG (1000 UNIT) tablet Take 1,000 Units by mouth in the morning and at bedtime.    [provider]  losartan (COZAAR) 50 MG tablet Take 1 tablet (50 mg total) by mouth daily. 04/24/22   Shade Flood, MD  simvastatin (ZOCOR) 40 MG  tablet TAKE 1 TABLET BY MOUTH EVERY DAY 10/29/21   Janeece Agee, NP  simvastatin (ZOCOR) 40 MG tablet Take by mouth.    [provider]  fexofenadine (ALLEGRA) 180 MG tablet Take 1 tablet (180 mg total) by mouth daily. Patient taking differently: Take 180 mg by mouth daily as needed for allergies.  08/22/15 04/15/19  Linna Hoff, MD  fluticasone (FLONASE) 50 MCG/ACT nasal spray Place 1 spray into both nostrils 2 (two) times daily. Patient taking differently: Place 1 spray into both nostrils daily as needed for allergies.  08/22/15 09/15/19  Linna Hoff, MD      Allergies    Codeine, Amoxicillin, Vytorin [ezetimibe-simvastatin], and Lipitor [atorvastatin]    Review of Systems   Review of Systems  Constitutional:  Positive for activity change, appetite change and fatigue. Negative for fever.  HENT:  Positive for congestion, rhinorrhea and sore throat.   Eyes:  Negative for photophobia and visual disturbance.  Respiratory:  Positive for cough.   Cardiovascular:  Negative for chest pain.  Gastrointestinal:  Positive for nausea. Negative for abdominal distention and vomiting.  Genitourinary:  Negative for dysuria and hematuria.  Musculoskeletal:  Positive for arthralgias and myalgias.  Neurological:  Positive for weakness and headaches.   all other systems are negative except as noted in the HPI and PMH.    Physical Exam Updated Vital Signs BP (!) 110/92 (BP Location:  Left Arm)   Pulse 99   Temp 98.4 F (36.9 C) (Oral)   Resp 18   Ht 5\' 5"  (1.651 m)   Wt 79.4 kg   SpO2 98%   BMI 29.12 kg/m  Physical Exam Vitals and nursing note reviewed.  Constitutional:      General: She is not in acute distress.    Appearance: She is well-developed. She is not ill-appearing or toxic-appearing.  HENT:     Head: Normocephalic and atraumatic.     Comments: No temporal artery tenderness    Right Ear: Tympanic membrane normal.     Left Ear: Tympanic membrane normal.     Nose:  Congestion present.     Mouth/Throat:     Pharynx: No oropharyngeal exudate.  Eyes:     Intraocular pressure: Right eye pressure is 14 mmHg. Left eye pressure is 16 mmHg. Measurements were taken using a handheld tonometer.    Conjunctiva/sclera: Conjunctivae normal.     Left eye: No chemosis.    Pupils: Pupils are equal, round, and reactive to light.     Comments: No areas of foreseen uptake bilaterally.  No hyphema, no hypopyon.  Neck:     Comments: No meningismus. Cardiovascular:     Rate and Rhythm: Normal rate and regular rhythm.     Heart sounds: Normal heart sounds. No murmur heard. Pulmonary:     Effort: Pulmonary effort is normal. No respiratory distress.     Breath sounds: Normal breath sounds.  Abdominal:     Palpations: Abdomen is soft.     Tenderness: There is no abdominal tenderness. There is no guarding or rebound.  Musculoskeletal:        General: No tenderness. Normal range of motion.     Cervical back: Normal range of motion and neck supple.  Skin:    General: Skin is warm.  Neurological:     Mental Status: She is alert and oriented to person, place, and time.     Cranial Nerves: No cranial nerve deficit.     Motor: No abnormal muscle tone.     Coordination: Coordination normal.     Comments:  5/5 strength throughout. CN 2-12 intact.Equal grip strength.   Psychiatric:        Behavior: Behavior normal.     ED Results / Procedures / Treatments   Labs (all labs ordered are listed, but only abnormal results are displayed) Labs Reviewed  RESP PANEL BY RT-PCR (RSV, FLU A&B, COVID)  RVPGX2 - Abnormal; Notable for the following components:      Result Value   SARS Coronavirus 2 by RT PCR POSITIVE (*)    All other components within normal limits  COMPREHENSIVE METABOLIC PANEL - Abnormal; Notable for the following components:   Creatinine, Ser 1.07 (*)    Total Bilirubin 1.9 (*)    GFR, Estimated 54 (*)    All other components within normal limits  URINALYSIS,  ROUTINE W REFLEX MICROSCOPIC - Abnormal; Notable for the following components:   Color, Urine AMBER (*)    APPearance CLOUDY (*)    Hgb urine dipstick SMALL (*)    Ketones, ur 5 (*)    Protein, ur 30 (*)    Leukocytes,Ua MODERATE (*)    Bacteria, UA RARE (*)    All other components within normal limits  SEDIMENTATION RATE - Abnormal; Notable for the following components:   Sed Rate 69 (*)    All other components within normal limits  LIPASE, BLOOD  CBC    EKG None  Radiology CT ANGIO HEAD NECK W WO CM Result Date: 05/29/2023 CLINICAL DATA:  Headache for 2 days with weakness and nausea EXAM: CT ANGIOGRAPHY HEAD AND NECK WITH AND WITHOUT CONTRAST TECHNIQUE: Multidetector CT imaging of the head and neck was performed using the standard protocol during bolus administration of intravenous contrast. Multiplanar CT image reconstructions and MIPs were obtained to evaluate the vascular anatomy. Carotid stenosis measurements (when applicable) are obtained utilizing NASCET criteria, using the distal internal carotid diameter as the denominator. RADIATION DOSE REDUCTION: This exam was performed according to the departmental dose-optimization program which includes automated exposure control, adjustment of the mA and/or kV according to patient size and/or use of iterative reconstruction technique. CONTRAST:  75mL OMNIPAQUE IOHEXOL 350 MG/ML SOLN COMPARISON:  Noncontrast head CT from earlier today FINDINGS: CTA NECK FINDINGS Aortic arch: Atheromatous plaque with 4 vessel branching. Right carotid system: No stenosis or ulceration. Negative for beading. Limited atheromatous change compared to age. Left carotid system: Calcified plaque at the bifurcation and proximal ICA without significant stenosis, ulceration, or beading. Vertebral arteries: The left vertebral artery arises from the arch. The right vertebral artery is dominant. No proximal subclavian or vertebral stenosis, beading, or dissection. Skeleton:  Ordinary degenerative spurring in the cervical spine. Other neck: No acute or aggressive finding there are enlarged lymph nodes in the bilateral jugular chain with homogeneous enhancement, a right level 2 node measures 12 mm in length on 8:121. These nodes are newly thickened when compared to 2023 cervical spine CT. Overall mild mucosal thickening in paranasal sinuses. Upper chest: Negative Review of the MIP images confirms the above findings CTA HEAD FINDINGS Anterior circulation: No significant stenosis, proximal occlusion, aneurysm, or vascular malformation. Atheromatous calcification of the cavernous carotids. Posterior circulation: The vertebral and basilar arteries are smoothly contoured and diffusely patent. No branch occlusion, beading, or aneurysm. Venous sinuses: Unremarkable Anatomic variants: Hypoplastic P1 segments. Review of the MIP images confirms the above findings IMPRESSION: 1. No emergent arterial finding. 2. Atherosclerosis without significant stenosis or irregularity of major arteries in the head and neck. 3. Generalized mild enlargement of cervical lymph nodes since 2023 cervical spine CT, would recommend clinical or imaging follow-up if there is no known cause such as URI. Electronically Signed   By: Tiburcio Pea M.D.   On: 05/29/2023 06:46   DG Chest 2 View Result Date: 05/29/2023 CLINICAL DATA:  Headache for 2 days with weakness and nausea. EXAM: CHEST - 2 VIEW COMPARISON:  09/21/2019 FINDINGS: Normal heart size and mediastinal contours. No acute infiltrate or edema. No effusion or pneumothorax. No acute osseous findings. IMPRESSION: Negative chest. Electronically Signed   By: Tiburcio Pea M.D.   On: 05/29/2023 05:12   CT Head Wo Contrast Result Date: 05/29/2023 CLINICAL DATA:  Headache, new onset (Age >= 51y) EXAM: CT HEAD WITHOUT CONTRAST TECHNIQUE: Contiguous axial images were obtained from the base of the skull through the vertex without intravenous contrast. RADIATION DOSE  REDUCTION: This exam was performed according to the departmental dose-optimization program which includes automated exposure control, adjustment of the mA and/or kV according to patient size and/or use of iterative reconstruction technique. COMPARISON:  CT head October 31, 2015. FINDINGS: Brain: No evidence of acute infarction, hemorrhage, hydrocephalus, extra-axial collection or mass lesion/mass effect. Vascular: No hyperdense vessel. Skull: No acute fracture. Sinuses/Orbits: Mostly clear sinuses.  No acute orbital findings. Other: No mastoid effusions. IMPRESSION: No evidence of acute intracranial abnormality. Electronically Signed   By: Gelene Mink  Barnett Applebaum M.D.   On: 05/29/2023 03:19    Procedures Procedures    Medications Ordered in ED Medications  fluorescein ophthalmic strip 1 strip (has no administration in time range)  tetracaine (PONTOCAINE) 0.5 % ophthalmic solution 2 drop (has no administration in time range)  lactated ringers bolus 1,000 mL (has no administration in time range)  ketorolac (TORADOL) 30 MG/ML injection 15 mg (has no administration in time range)  metoCLOPramide (REGLAN) injection 10 mg (has no administration in time range)  diphenhydrAMINE (BENADRYL) injection 12.5 mg (has no administration in time range)    ED Course/ Medical Decision Making/ A&P                                 Medical Decision Making Amount and/or Complexity of Data Reviewed Labs: ordered. Decision-making details documented in ED Course. Radiology: ordered and independent interpretation performed. Decision-making details documented in ED Course. ECG/medicine tests: ordered and independent interpretation performed. Decision-making details documented in ED Course.  Risk Prescription drug management.   2 days of severe headache progressively worsening with cough and congestion and chills.  No fever.  Neurologic exam is nonfocal.  Concern for likely viral syndrome.  Low suspicion of intracranial  hemorrhage.  CT head in triage is negative for hemorrhage.  However given patient's description of sudden onset headache that is the worst she is ever had will obtain CTA to rule out aneurysm.  COVID test is positive and likely the source of her symptoms. Visual Acuity R Near: 20/25 L Near: 20/25  Headache has improved with Toradol, Reglan and Benadryl.  Blood pressure has normalized.  She remains afebrile without meningismus. CTA negative for aneurysm or large vessel occlusion  ESR noted to be elevated at 69.  She has no discrete temporal artery tenderness.  Her vision is 20/25 bilaterally.  No visual changes.  Discussed with Dr. Derry Lory of neurology who agrees this is likely secondary to COVID infection and unlikely to be giant cell arteritis.  Will treat her COVID with supportive care and oral hydration.  She is on multiple medications which preclude the use of antivirals.  Empiric course of steroids to be given given her elevated ESR and bitemporal pain but lower suspicion for temporal arteritis.  Will refer to neurology for follow-up.  No hypoxia or increased work of breathing.  She is tolerating p.o.  Chest x-ray is clear.  No indication to admit to the hospital for her COVID from respiratory standpoint.   Follow-up with PCP.  Follow-up with neurology as well.  Return to the ED with new or worsening symptoms.        Final Clinical Impression(s) / ED Diagnoses Final diagnoses:  COVID-19  Bad headache    Rx / DC Orders ED Discharge Orders     None         Cayleen Benjamin, Jeannett Senior, MD 05/29/23 [phone]

## 2023-05-29 NOTE — Discharge Instructions (Signed)
COVID test is positive which is likely the source of your headache.  Use Tylenol or Motrin for headache and fever.  Keep yourself hydrated.  Follow-up with your primary doctor as well as the neurologist for further evaluation of your headache.  Your inflammatory marker called ESR was slightly elevated today and you are given a course of steroids to help reduce any potential inflammation of your temporal arteries. Return to the ED with difficulty breathing, chest pain, unable eat or drink or other concerns.

## 2023-06-01 IMAGING — CT CT RENAL STONE PROTOCOL
2 of 3 series · 15 of 46 positions shown, 17 images · non-contrast
Comparison: None

CLINICAL DATA: A 75-year-old female presents with LEFT-sided flank
and pelvic pain for 3 days.

EXAM:
CT ABDOMEN AND PELVIS WITHOUT CONTRAST
TECHNIQUE: Multidetector CT imaging of the abdomen and pelvis was performed
following the standard protocol without IV contrast.

[Series 4: lung bases · axial · 0.62mm/px · z∈[+1273,+1371]mm · 12 of 57 slices shown, 14 images]
[im 4/57  soft-tissue]
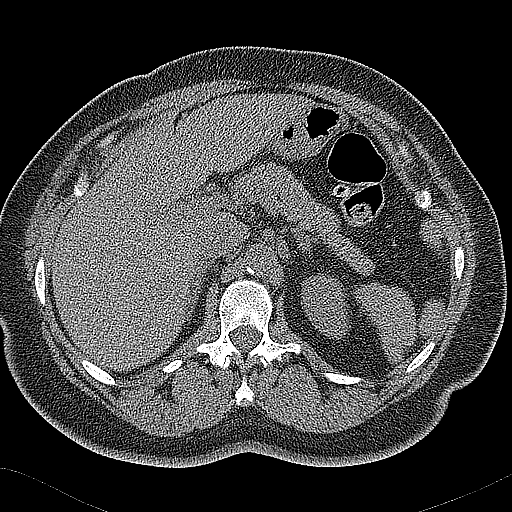
[im 4/57  bone]
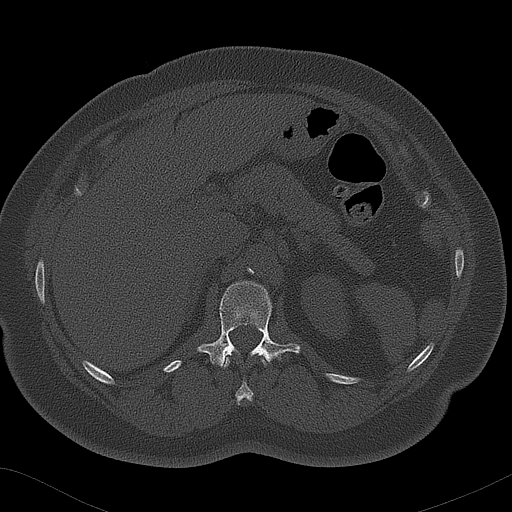
[im 8/57  soft-tissue]
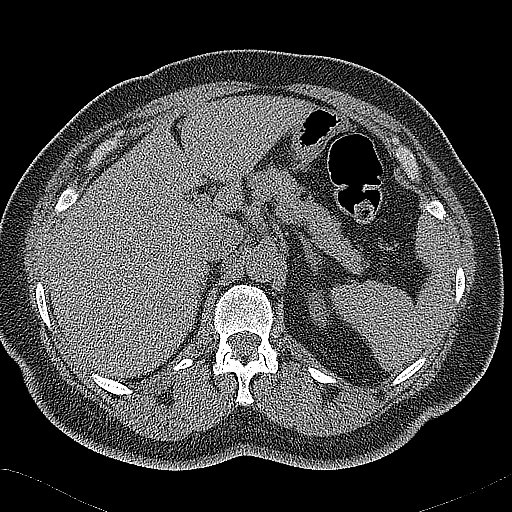
[im 13/57  soft-tissue]
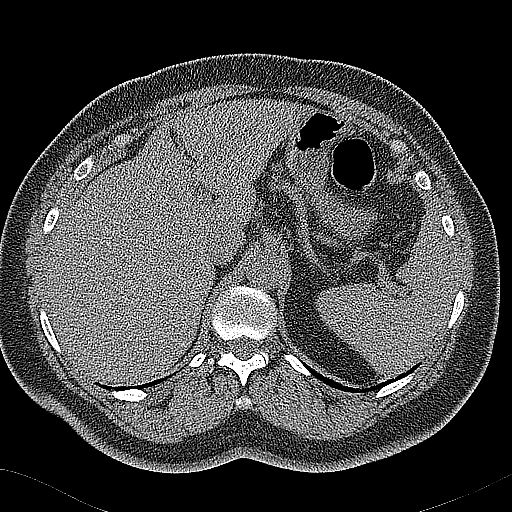
[im 17/57  soft-tissue]
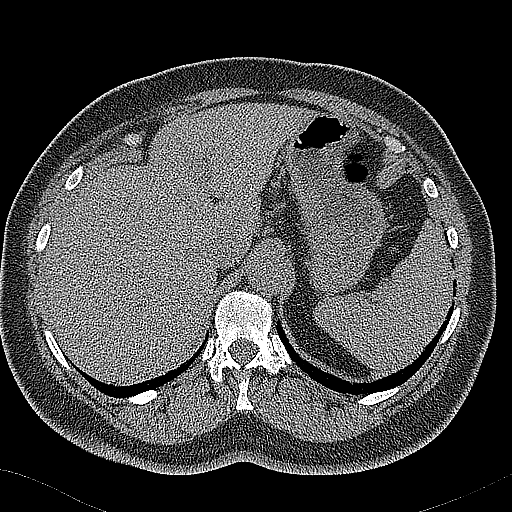
[im 22/57  soft-tissue]
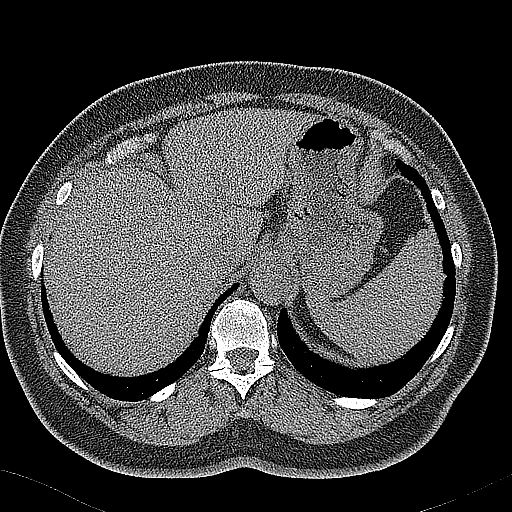
[im 26/57  soft-tissue]
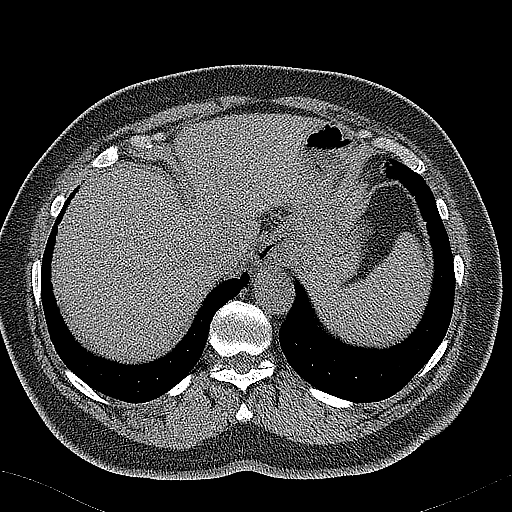
[im 31/57  soft-tissue]
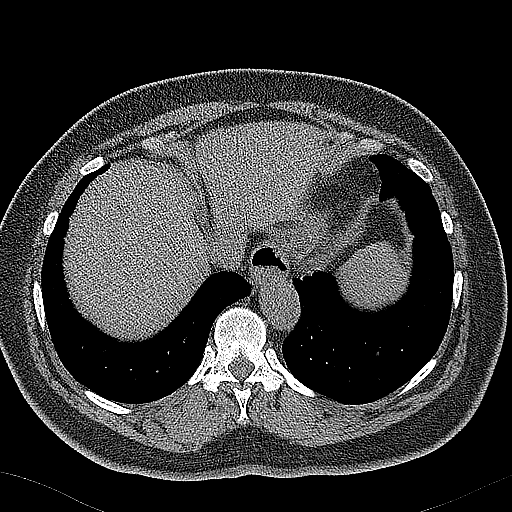
[im 35/57  soft-tissue]
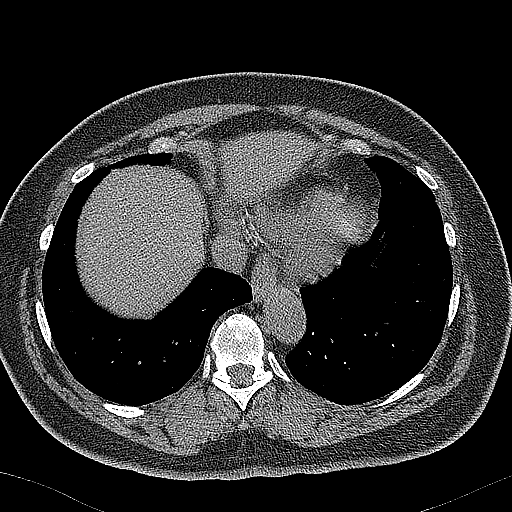
[im 40/57  soft-tissue]
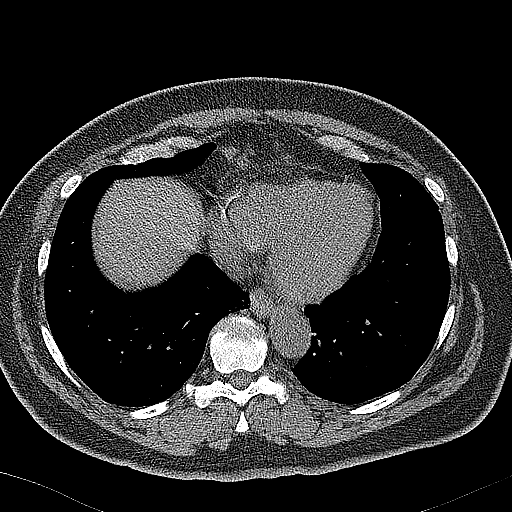
[im 40/57  bone]
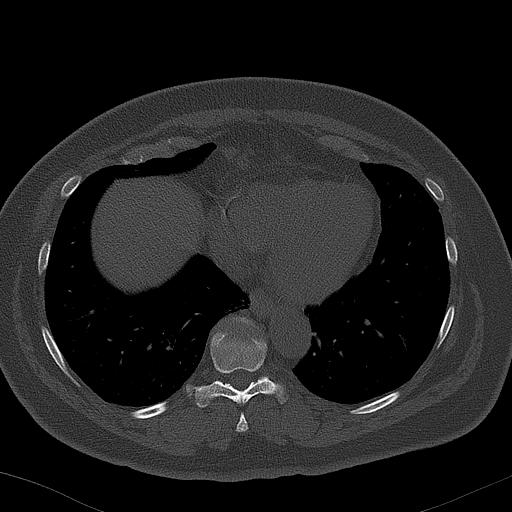
[im 44/57  soft-tissue]
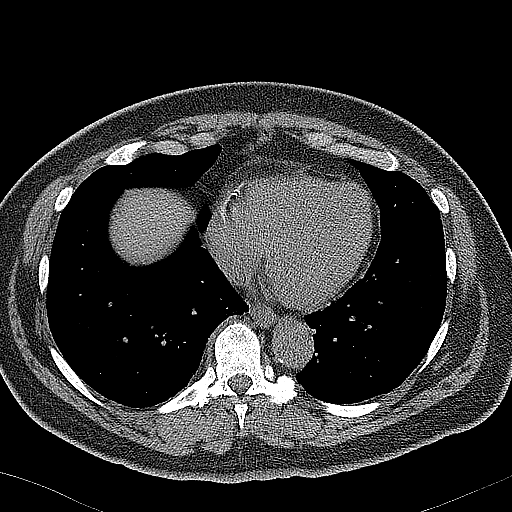
[im 49/57  soft-tissue]
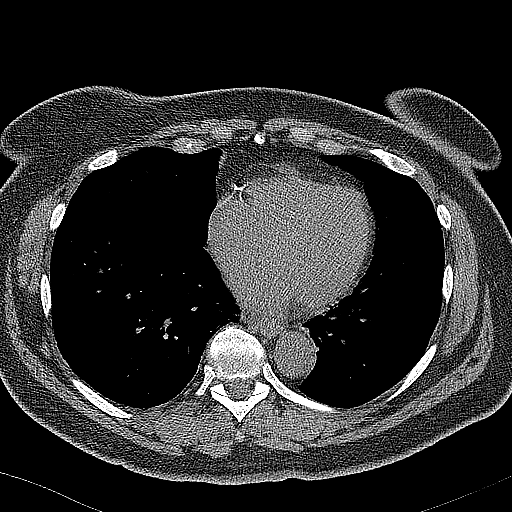
[im 53/57  soft-tissue]
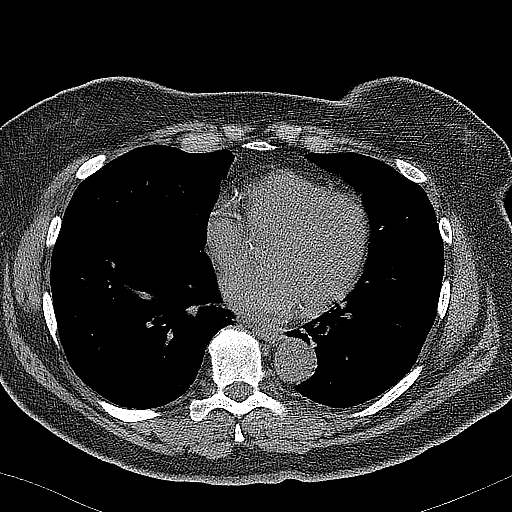

[Series 5: coronal · coronal · 0.72mm/px · 3 of 151 slices shown]
[im 51/151  soft-tissue]
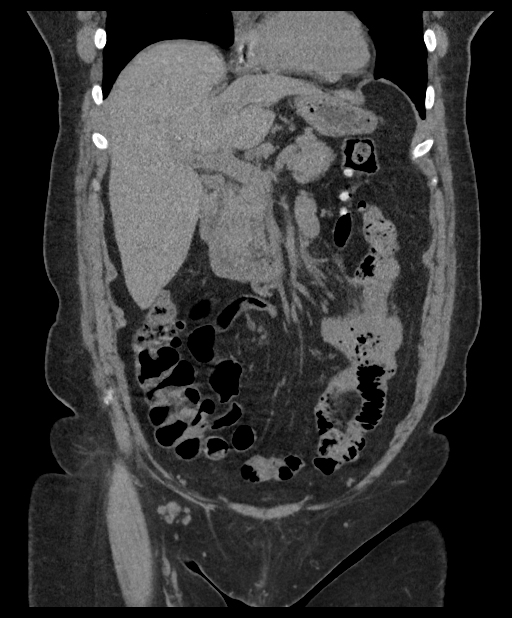
[im 67/151  soft-tissue]
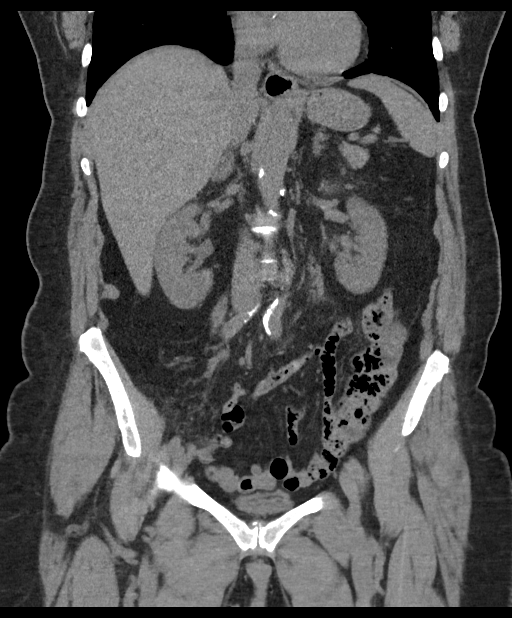
[im 84/151  soft-tissue]
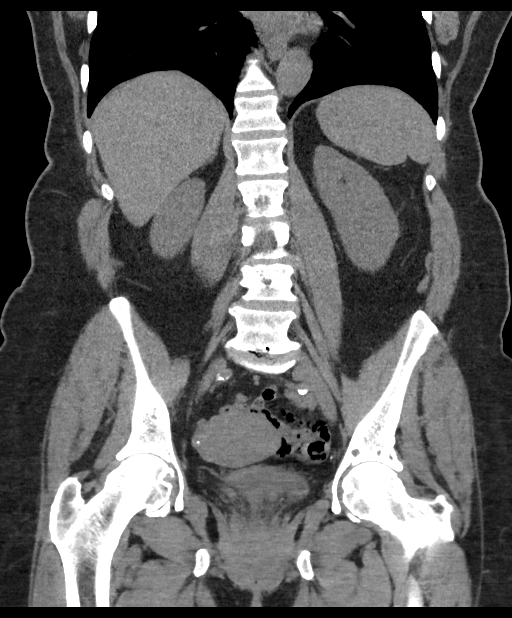

[15 of 46 positions shown; findings below may reference images not displayed]

FINDINGS: Lower chest: Small pulmonary nodule in the RIGHT lower lobe (image
[DATE]) 6 mm. Adjacent subtle nodule approximately 5 mm on image ([DATE]).
No effusion. No consolidative changes.

Hepatobiliary: Smooth hepatic contours. No signs of pericholecystic
stranding or gross biliary duct distension.

Pancreas: Pancreas without signs of inflammation. Smooth contour the
pancreas.

Spleen: Normal.

Adrenals/Urinary Tract: RIGHT lower pole with small renal cyst
measuring 1.3 cm. Small presumed cyst along the anterior cortex LEFT
kidney measuring slightly less than a cm with water density in
homogeneous appearance.

Subtle peripelvic stranding on the LEFT Peri ureteral stranding also
on the LEFT. The ureter is mildly patulous as it tracks behind a
dense diverticulum at the sacral promontory. Question of tiny
calculus midportion of the LEFT ureter at the junction of middle and
distal thirds. This is not clear. There is no visible calculus
beyond this location or ureteral dilation beyond this location.

No peripelvic stranding or perinephric stranding on the RIGHT. There
is no hydronephrosis in either kidney at this time. No
nephrolithiasis is visible within either kidney.

Urinary bladder is collapsed without visible calcium.

Stomach/Bowel: Hiatal hernia. No perigastric stranding. No acute
bowel process.

Colonic diverticulosis. Normal appendix. Diverticular changes are
seen throughout the entire colon. Redundant sigmoid without signs of
obstruction. In the area of the proximal sigmoid there is a
diverticulum which is immediately adjacent to the site of mild
ureteral stranding and potential very minimal ureteral transition
but there is no surrounding inflammation

Vascular/Lymphatic:

Aortic atherosclerosis. No sign of aneurysm. Smooth contour of the
IVC. There is no gastrohepatic or hepatoduodenal ligament
lymphadenopathy. No retroperitoneal or mesenteric lymphadenopathy.

No pelvic sidewall lymphadenopathy.

Limited assessment of vascular structures due to lack of intravenous
contrast.

Reproductive: Unremarkable by CT.

Other: No ascites.  No free air.

Musculoskeletal: No acute bone finding. No destructive bone process.
Spinal degenerative changes.
IMPRESSION: Peri ureteral and peripelvic stranding on the LEFT with mild
fullness of the LEFT ureter, no current signs of hydronephrosis.
Findings could be seen in the setting of urinary tract infection or
recently passed calculus. No definitive signs of ureteral calculus
but very subtle calcification may be present in the distal aspect of
the middle third of the LEFT ureter. Dense adjacent diverticulum
limits assessment in this area. Correlate with urine studies. If
there is persistent hematuria on follow-up could consider dedicated
hematuria evaluation for further assessment.

Colonic diverticulosis without current signs of inflammation.

Hiatal hernia.

Small pulmonary nodules in the RIGHT lower lobe. Largest is
approximately 6 mm. Non-contrast chest CT at 3-6 months is
recommended. If the nodules are stable at time of repeat CT, then
future CT at 18-24 months (from today's scan) is considered optional
for low-risk patients, but is recommended for high-risk patients.
This recommendation follows the consensus statement: Guidelines for
Management of Incidental Pulmonary Nodules Detected on CT Images:

## 2023-09-23 ENCOUNTER — Ambulatory Visit: Payer: Medicare (Managed Care) | Admitting: Neurology

## 2024-01-23 ENCOUNTER — Emergency Department (HOSPITAL_COMMUNITY)
Admission: EM | Admit: 2024-01-23 | Discharge: 2024-01-23 | Disposition: A | Discharge status: Home / Self Care | Payer: Medicare (Managed Care) | Attending: Emergency Medicine | Admitting: Emergency Medicine

## 2024-01-23 ENCOUNTER — Emergency Department (HOSPITAL_COMMUNITY): Payer: Medicare (Managed Care)

## 2024-01-23 DIAGNOSIS — R0602 Shortness of breath: Secondary | ICD-10-CM | POA: Diagnosis not present

## 2024-01-23 DIAGNOSIS — M79601 Pain in right arm: Secondary | ICD-10-CM | POA: Insufficient documentation

## 2024-01-23 DIAGNOSIS — R079 Chest pain, unspecified: Secondary | ICD-10-CM | POA: Insufficient documentation

## 2024-01-23 DIAGNOSIS — M79602 Pain in left arm: Secondary | ICD-10-CM | POA: Insufficient documentation

## 2024-01-23 DIAGNOSIS — M7918 Myalgia, other site: Secondary | ICD-10-CM

## 2024-01-23 LAB — COMPREHENSIVE METABOLIC PANEL WITH GFR
ALT: 14 U/L (ref 0–44)
AST: 17 U/L (ref 15–41)
Albumin: 4.3 g/dL (ref 3.5–5.0)
Alkaline Phosphatase: 67 U/L (ref 38–126)
Anion gap: 13 (ref 5–15)
BUN: 11 mg/dL (ref 8–23)
CO2: 23 mmol/L (ref 22–32)
Calcium: 10 mg/dL (ref 8.9–10.3)
Chloride: 103 mmol/L (ref 98–111)
Creatinine, Ser: 0.81 mg/dL (ref 0.44–1.00)
GFR, Estimated: >60 mL/min (ref >60)
Glucose, Bld: 89 mg/dL (ref 70–99)
Potassium: 4.1 mmol/L (ref 3.5–5.1)
Sodium: 139 mmol/L (ref 135–145)
Total Bilirubin: 1.7 mg/dL — ABNORMAL HIGH (ref 0.0–1.2)
Total Protein: 7.5 g/dL (ref 6.5–8.1)

## 2024-01-23 LAB — CBC WITH DIFFERENTIAL/PLATELET
Abs Immature Granulocytes: 0.01 K/uL (ref 0.00–0.07)
Basophils Absolute: 0 K/uL (ref 0.0–0.1)
Basophils Relative: 1 %
Eosinophils Absolute: 0.1 K/uL (ref 0.0–0.5)
Eosinophils Relative: 2 %
HCT: 35.9 % — ABNORMAL LOW (ref 36.0–46.0)
Hemoglobin: 11.7 g/dL — ABNORMAL LOW (ref 12.0–15.0)
Immature Granulocytes: 0 %
Lymphocytes Relative: 29 %
Lymphs Abs: 1.3 K/uL (ref 0.7–4.0)
MCH: 30.8 pg (ref 26.0–34.0)
MCHC: 32.6 g/dL (ref 30.0–36.0)
MCV: 94.5 fL (ref 80.0–100.0)
Monocytes Absolute: 0.4 K/uL (ref 0.1–1.0)
Monocytes Relative: 8 %
Neutro Abs: 2.8 K/uL (ref 1.7–7.7)
Neutrophils Relative %: 60 %
Platelets: 224 K/uL (ref 150–400)
RBC: 3.8 MIL/uL — ABNORMAL LOW (ref 3.87–5.11)
RDW: 12.1 % (ref 11.5–15.5)
WBC: 4.6 K/uL (ref 4.0–10.5)
nRBC: 0 % (ref 0.0–0.2)

## 2024-01-23 LAB — TROPONIN T, HIGH SENSITIVITY
Troponin T High Sensitivity: <15 ng/L (ref 0–19)
Troponin T High Sensitivity: <15 ng/L (ref 0–19)

## 2024-01-23 MED ORDER — GABAPENTIN 100 MG PO CAPS: 100.0000 mg | ORAL_CAPSULE | Freq: Two times a day (BID) | ORAL | 0 refills | Status: AC

## 2024-01-23 NOTE — ED Triage Notes (Signed)
 Pt states that yesterday she began having bilateral arm/shoulder pain L>R. Today she also began having worsening DOE. Pt states she had CP briefly this morning but has since resolved.

## 2024-01-23 NOTE — ED Provider Triage Note (Signed)
 Emergency Medicine Provider Triage Evaluation Note  Susan Berg , a 78 y.o. female  was evaluated in triage.  Pt complains of bilateral arm pain.  Patient was seen previously for this 2 years ago.  Reports it feels very similar.  She followed up with physical therapy about it however did not see a spine surgeon.  She reports that she did have some shortness of breath but thinks that she may have been scaring herself after googling her symptoms and thinking that she was having a blockage.  No headaches.  Does not have any neck pain.  No fevers.  Reports that she maybe had a moment of chest pain but has not experienced anything since.  Denies any injury or trauma to the area.  Reports that she has some tingling radiating down her right greater than left more to the pinky and fourth finger.  Review of Systems  Positive:  Negative:   Physical Exam  BP (!) 140/80   Pulse 90   Temp 98.5 F (36.9 C)   Resp 18   SpO2 100%  Gen:   Awake, no distress   Resp:  Normal effort  MSK:   Moves extremities without difficulty  Other:  Equal grip strength.  Strength 5 of 5 in upper extremities.  Sensation reportedly intact and symmetric.  Palpable radial pulses that are symmetric as well.  No midline or paraspinal neck tenderness to palpation.  Medical Decision Making  Medically screening exam initiated at 6:17 PM.  Appropriate orders placed.  Susan Berg was informed that the remainder of the evaluation will be completed by another provider, this initial triage assessment does not replace that evaluation, and the importance of remaining in the ED until their evaluation is complete.  CT and labs ordered.  From previous chart evaluation, patient's been seen for this issue previously.  This is a follow-up with spine surgery however did not.  She reports that she did complete physical therapy for the issue though.  Denies any headaches.  Not strains any shortness breath or chest pain now.   Bernis Ernst,  [place] 01/23/24 [id number]

## 2024-01-23 NOTE — ED Provider Notes (Signed)
 [facility] Provider Note   CSN: [id number] Arrival date & time: 01/23/24  1716     Patient presents with: Shortness of Breath and Arm Pain   Susan Berg is a 78 y.o. female.   Patient to ED c/o pain in bilateral arms since around 2:00 AM this morning. No numbness or weakness. She reports similar episodes in the past, last was about 2 years ago. She states she did some heavy lifting yesterday, moving a chair while cleaning her son's home. She she complains of vague symptoms of chest discomfort this morning and associated very mild SOB. No cough, fever, pleuritic pain. She reports brief episode of mild nausea without vomiting. She states she felt it might have been indigestion and she ate some applesauce with improvement. No further chest discomfort through the day.   The history is provided by the patient. No language interpreter was used.  Shortness of Breath Arm Pain Associated symptoms include shortness of breath.       Prior to Admission medications   Medication Sig Start Date End Date Taking? Authorizing Provider  gabapentin  (NEURONTIN ) 100 MG capsule Take 1 capsule (100 mg total) by mouth 2 (two) times daily. 01/23/24  Yes Sante Biedermann, PA-C  amLODipine  (NORVASC ) 5 MG tablet Take 1 tablet (5 mg total) by mouth daily. 04/24/22   Levora Reyes SAUNDERS, MD  azelastine  (ASTELIN ) 0.1 % nasal spray Place 2 sprays into both nostrils 2 (two) times daily. Use in each nostril as directed 04/02/22   Joesph Shaver Scales, PA-C  cholecalciferol (VITAMIN D3) 25 MCG (1000 UNIT) tablet Take 1,000 Units by mouth in the morning and at bedtime.    [provider]  losartan  (COZAAR ) 50 MG tablet Take 1 tablet (50 mg total) by mouth daily. 04/24/22   Levora Reyes SAUNDERS, MD  predniSONE  (DELTASONE ) 50 MG tablet 1 tablet PO daily 05/29/23   Rancour, Garnette, MD  simvastatin  (ZOCOR ) 40 MG tablet TAKE 1 TABLET BY MOUTH EVERY DAY 10/29/21   Kip Ade, NP  simvastatin  (ZOCOR ) 40 MG tablet Take by mouth.    [provider]  fexofenadine  (ALLEGRA ) 180 MG tablet Take 1 tablet (180 mg total) by mouth daily. Patient taking differently: Take 180 mg by mouth daily as needed for allergies.  08/22/15 04/15/19  Vincente Lynwood BIRCH, MD  fluticasone  (FLONASE ) 50 MCG/ACT nasal spray Place 1 spray into both nostrils 2 (two) times daily. Patient taking differently: Place 1 spray into both nostrils daily as needed for allergies.  08/22/15 09/15/19  Vincente Lynwood BIRCH, MD    Allergies: Codeine, Amoxicillin, Vytorin [ezetimibe-simvastatin ], and Lipitor [atorvastatin]    Review of Systems  Respiratory:  Positive for shortness of breath.     Updated Vital Signs BP 106/60   Pulse 72   Temp 98.6 F (37 C)   Resp 18   SpO2 99%   Physical Exam Constitutional:      Appearance: She is well-developed.  HENT:     Head: Normocephalic.  Cardiovascular:     Rate and Rhythm: Normal rate and regular rhythm.     Heart sounds: No murmur heard. Pulmonary:     Effort: Pulmonary effort is normal.     Breath sounds: Normal breath sounds. No wheezing, rhonchi or rales.  Abdominal:     General: Bowel sounds are normal.     Palpations: Abdomen is soft.     Tenderness: There is no abdominal tenderness. There is no guarding or rebound.  Musculoskeletal:        General: Normal range of motion.     Cervical back: Normal range of motion and neck supple.     Right lower leg: No edema.     Left lower leg: No edema.  Skin:    General: Skin is warm and dry.  Neurological:     General: No focal deficit present.     Mental Status: She is alert and oriented to person, place, and time.     (all labs ordered are listed, but only abnormal results are displayed) Labs Reviewed  CBC WITH DIFFERENTIAL/PLATELET - Abnormal; Notable for the following components:      Result Value   RBC 3.80 (*)    Hemoglobin 11.7 (*)    HCT 35.9 (*)    All other components within  normal limits  COMPREHENSIVE METABOLIC PANEL WITH GFR - Abnormal; Notable for the following components:   Total Bilirubin 1.7 (*)    All other components within normal limits  TROPONIN T, HIGH SENSITIVITY  TROPONIN T, HIGH SENSITIVITY   Results for orders placed or performed during the hospital encounter of 01/23/24  CBC with Differential   Collection Time: 01/23/24  6:23 PM  Result Value Ref Range   WBC 4.6 4.0 - 10.5 K/uL   RBC 3.80 (L) 3.87 - 5.11 MIL/uL   Hemoglobin 11.7 (L) 12.0 - 15.0 g/dL   HCT 64.0 (L) 63.9 - 53.9 %   MCV 94.5 80.0 - 100.0 fL   MCH 30.8 26.0 - 34.0 pg   MCHC 32.6 30.0 - 36.0 g/dL   RDW 87.8 88.4 - 84.4 %   Platelets 224 150 - 400 K/uL   nRBC 0.0 0.0 - 0.2 %   Neutrophils Relative % 60 %   Neutro Abs 2.8 1.7 - 7.7 K/uL   Lymphocytes Relative 29 %   Lymphs Abs 1.3 0.7 - 4.0 K/uL   Monocytes Relative 8 %   Monocytes Absolute 0.4 0.1 - 1.0 K/uL   Eosinophils Relative 2 %   Eosinophils Absolute 0.1 0.0 - 0.5 K/uL   Basophils Relative 1 %   Basophils Absolute 0.0 0.0 - 0.1 K/uL   Immature Granulocytes 0 %   Abs Immature Granulocytes 0.01 0.00 - 0.07 K/uL  Comprehensive metabolic panel   Collection Time: 01/23/24  6:23 PM  Result Value Ref Range   Sodium 139 135 - 145 mmol/L   Potassium 4.1 3.5 - 5.1 mmol/L   Chloride 103 98 - 111 mmol/L   CO2 23 22 - 32 mmol/L   Glucose, Bld 89 70 - 99 mg/dL   BUN 11 8 - 23 mg/dL   Creatinine, Ser 9.18 0.44 - 1.00 mg/dL   Calcium 89.9 8.9 - 89.6 mg/dL   Total Protein 7.5 6.5 - 8.1 g/dL   Albumin 4.3 3.5 - 5.0 g/dL   AST 17 15 - 41 U/L   ALT 14 0 - 44 U/L   Alkaline Phosphatase 67 38 - 126 U/L   Total Bilirubin 1.7 (H) 0.0 - 1.2 mg/dL   GFR, Estimated >39 >39 mL/min   Anion gap 13 5 - 15  Troponin T, High Sensitivity   Collection Time: 01/23/24  6:23 PM  Result Value Ref Range   Troponin T High Sensitivity <15 0 - 19 ng/L  Troponin T, High Sensitivity   Collection Time: 01/23/24  8:50 PM  Result Value Ref  Range   Troponin T High Sensitivity <15 0 - 19 ng/L  EKG: None  Radiology: CT Cervical Spine Wo Contrast Result Date: 01/23/2024 CLINICAL DATA:  Cervical radiculopathy, bilateral arm pain EXAM: CT CERVICAL SPINE WITHOUT CONTRAST TECHNIQUE: Multidetector CT imaging of the cervical spine was performed without intravenous contrast. Multiplanar CT image reconstructions were also generated. RADIATION DOSE REDUCTION: This exam was performed according to the departmental dose-optimization program which includes automated exposure control, adjustment of the mA and/or kV according to patient size and/or use of iterative reconstruction technique. COMPARISON:  04/11/2022 FINDINGS: Alignment: Mild degenerative anterolisthesis of T1 on T2. Stable left convex curvature centered at the cervicothoracic junction. Skull base and vertebrae: No acute fracture. No primary bone lesion or focal pathologic process. Soft tissues and spinal canal: No prevertebral fluid or swelling. No visible canal hematoma. Disc levels: Multilevel spondylosis with disc space narrowing and osteophyte formation greatest at C4-5 and C5-6. Multilevel facet hypertrophic changes, greatest within the upper cervical spine. There is right predominant neural foraminal encroachment at C3-4, C4-5, and C5-6. Upper chest: Airway is patent.  Lung apices are clear. Other: Reconstructed images demonstrate no additional findings. IMPRESSION: 1. No acute cervical spine fracture. 2. Multilevel cervical degenerative changes as above, with right predominant neural foraminal narrowing from C3-4 through C5-6. Electronically Signed   By: Ozell Daring M.D.   On: 01/23/2024 19:25   DG Chest 2 View Result Date: 01/23/2024 CLINICAL DATA:  Shortness of breath. EXAM: CHEST - 2 VIEW COMPARISON:  None Available. FINDINGS: No focal consolidation, pleural effusion, pneumothorax. The cardiac silhouette is within normal limits. No acute osseous pathology. IMPRESSION: No active  cardiopulmonary disease. Electronically Signed   By: Vanetta Chou M.D.   On: 01/23/2024 18:42     Procedures   Medications Ordered in the ED - No data to display  Clinical Course as of 01/23/24 2153  Sat Jan 23, 2024  2038 Patient with bilateral arm pain, L>R since early morning as detailed in the HPI. Also reports mild CP this morning, resolved through the day.  Labs are reassuring, initial troponin is negative at <15. CXR clear. EKG sinus. No history heart disease, however, will obtain delta trop in elderly patient with concerning symptoms this morning. She is well appearing. CT c spine obtained and does not show any acute findings but has foraminal stenosis C3-4, C4-5, C5-6 which could be responsible for recurrent pain episode. Feel her UE pain related to heavy lifting yesterday. Will treat supportively.  [SU]  2143 Delta trop negative. Patient remains asymptomatic with any chest discomfort. VSS. She reports she has gabapentin  with her she has taken for similar arm pain and returns home to [place] Monday where she has additional medication. She is felt appropriate for discharge at this time. Return precautions discussed. [SU]    Clinical Course User Index [SU] Odell Balls, PA-C                                 Medical Decision Making       Final diagnoses:  Pain in both upper extremities  Musculoskeletal pain  Nonspecific chest pain    ED Discharge Orders          Ordered    gabapentin  (NEURONTIN ) 100 MG capsule  2 times daily        01/23/24 2151               Odell Balls, PA-C 01/23/24 2153    Mannie Pac T, DO 01/24/24 2319

## 2024-01-23 NOTE — Discharge Instructions (Addendum)
 See your doctor when you return home to [place]  next week. Take your gabapentin  as previously prescribed. A written prescription has been provided tonight in case you find you do not have enough gabapentin  with you. If you experience any further symptoms of indigestion, recommend Pepcid  twice daily which can be found in any drug store without a prescription.   Return to the ED with any new or concerning symptoms at any time.
